# Patient Record
Sex: Male | Born: 1940 | Race: White | Hispanic: No | Smoking: Never smoker
Health system: Southern US, Community
[De-identification: ages and names within clinical notes are randomized; demographics above are authoritative.]

## PROBLEM LIST (undated history)

## (undated) DIAGNOSIS — I4891 Unspecified atrial fibrillation: Secondary | ICD-10-CM

## (undated) DIAGNOSIS — I10 Essential (primary) hypertension: Secondary | ICD-10-CM

## (undated) DIAGNOSIS — N189 Chronic kidney disease, unspecified: Secondary | ICD-10-CM

## (undated) DIAGNOSIS — Z8679 Personal history of other diseases of the circulatory system: Secondary | ICD-10-CM

## (undated) DIAGNOSIS — M1990 Unspecified osteoarthritis, unspecified site: Secondary | ICD-10-CM

## (undated) HISTORY — DX: Personal history of other diseases of the circulatory system: Z86.79

## (undated) HISTORY — DX: Unspecified osteoarthritis, unspecified site: M19.90

## (undated) HISTORY — PX: TONSILLECTOMY: SUR1361

## (undated) HISTORY — PX: INGUINAL HERNIA REPAIR: SUR1180

---

## 1998-09-06 ENCOUNTER — Ambulatory Visit (HOSPITAL_BASED_OUTPATIENT_CLINIC_OR_DEPARTMENT_OTHER): Admission: RE | Admit: 1998-09-06 | Discharge: 1998-09-06 | Payer: Self-pay | Admitting: *Deleted

## 2010-03-06 ENCOUNTER — Ambulatory Visit (HOSPITAL_COMMUNITY)
Admission: RE | Admit: 2010-03-06 | Discharge: 2010-03-06 | Payer: Self-pay | Source: Home / Self Care | Attending: General Surgery | Admitting: General Surgery

## 2010-03-17 NOTE — Op Note (Signed)
NAMEZACHARIAH, Austin Wong               ACCOUNT NO.:  [id number]  MEDICAL RECORD NO.:  [id number]          PATIENT TYPE:  AMB  LOCATION:  DAY                          FACILITY:  [facility]  PHYSICIAN:  Lennie Muckle, MD      DATE OF BIRTH:  12/07/1940  DATE OF PROCEDURE:  03/06/2010 DATE OF DISCHARGE:                              OPERATIVE REPORT   PREOPERATIVE DIAGNOSIS:  Right inguinal hernia.  POSTOPERATIVE DIAGNOSIS:  Direct right inguinal hernia.  PROCEDURE:  Laparoscopic right inguinal hernia repair with mesh.  SURGEON:  Madlyn Crosby L. Freida Busman, MD  ASSISTANT:  OR staff.  FINDINGS:  Direct inguinal hernia.  SPECIMENS:  None.  BLOOD LOSS:  Minimal.  COMPLICATIONS:  No immediate complications.  ANESTHESIA:  General endotracheal anesthesia.  INDICATIONS FOR PROCEDURE:  Austin Wong is a 70 year old male who underwent a left inguinal hernia repair by Dr. Rozetta Nunnery several years ago.  He was noted to have a right inguinal hernia, the hernia began to enlarge over the years.  He has had some mild discomfort with lifting.  He desired to have repair.  I explained the open and laparoscopic approach.  He desired to have laparoscopic approach stating that he was rather active.  DESCRIPTION OF PROCEDURE:  Austin Wong was identified in preoperative holding area.  I marked the right inguinal area.  He received preoperative antibiotics and seen by Anesthesia and then taken down to the operating room.  Once in the operating room, placed in supine position.  He had voided prior to coming to the operating room and Foley catheter was not placed.  After administration of general endotracheal anesthesia, sequential compression devices were applied to his lower extremity.  His abdomen was then prepped and draped in usual sterile fashion.  Surgical time-out was performed.  I began by placing an incision beneath the umbilicus via subcutaneous tissue with electrocautery and blunt dissection.  I  identified the anterior rectus fascia.  I incised the anterior rectus fascia with #11 blade, finger dissecting the preperitoneal space.  I lifted away the right external oblique fascia and placed a balloon spacer in the preperitoneal space. I insufflated this under inspection with camera.  After insufflating approximately 30 times, I retracted the balloon somewhat cranially. There was a band superiorly which I broke with my finger and replaced, laparoscopic space was closed and insufflated.  I then placed laparoscopic port in the preperitoneal space and obtained pneumoperitoneum.  I identified no injury and the peritoneum was intact. I then placed 2 ports in the midline under visualization with camera.  I began dissecting laterally along the abdominal wall, pushing the peritoneum inferiorly.  I then made my way down to the internal ring, identified the vas deferens and the spermatic cord vessels.  There was no evidence of hernia immediately along the spermatic cord.  However, immediately I did see a direct inguinal hernia in the abdominal wall. There were no intestinal contents within the hernia.  After I dissected around the spermatic cord vessels, I placed a 3 x 6 piece of Ultrapro mesh into the preperitoneal space.  I tacked this around the direct  hernia defect, and tacked Coopers ligament, and then superiorly around the hernia defect making sure the mesh was taut, I then placed the tack laterally while palpating along the abdominal wall.  With satisfactory placement of the mesh, I held the inferior edge and allowed pneumoperitoneum to release.  After releasing the pneumoperitoneum, I removed the trocars and closed the fascial defect at the umbilical region using 0 Vicryl suture.  Skin was closed with 4-0 Monocryl, Dermabond was placed on dressing at all 5.  The patient was awoken and transferred to postanesthesia care unit in stable condition and will be discharged home with Percocet,  start ibuprofen or Motrin tomorrow, and follow up with me in approximately 3 weeks.     Lennie Muckle, MD     ALA/MEDQ  D:  03/06/2010  T:  03/06/2010  Job:  [id number]  cc:   Gloriajean Dell. Andrey Campanile, M.D. Fax: [phone]  Electronically Signed by Bertram Savin MD on 03/17/2010 02:49:58 PM

## 2010-05-08 LAB — CBC
HCT: 46.7 % (ref 39.0–52.0)
Hemoglobin: 16.4 g/dL (ref 13.0–17.0)
MCH: 31.6 pg (ref 26.0–34.0)
MCHC: 35.1 g/dL (ref 30.0–36.0)
MCV: 90 fL (ref 78.0–100.0)
Platelets: 169 10*3/uL (ref 150–400)
RBC: 5.19 MIL/uL (ref 4.22–5.81)
RDW: 12.5 % (ref 11.5–15.5)
WBC: 7.1 10*3/uL (ref 4.0–10.5)

## 2010-05-08 LAB — DIFFERENTIAL
Basophils Absolute: 0.1 10*3/uL (ref 0.0–0.1)
Basophils Relative: 1 % (ref 0–1)
Eosinophils Absolute: 0.2 10*3/uL (ref 0.0–0.7)
Eosinophils Relative: 2 % (ref 0–5)
Lymphocytes Relative: 23 % (ref 12–46)
Lymphs Abs: 1.6 10*3/uL (ref 0.7–4.0)
Monocytes Absolute: 0.6 10*3/uL (ref 0.1–1.0)
Monocytes Relative: 8 % (ref 3–12)
Neutro Abs: 4.6 10*3/uL (ref 1.7–7.7)
Neutrophils Relative %: 66 % (ref 43–77)

## 2010-05-08 LAB — BASIC METABOLIC PANEL
BUN: 11 mg/dL (ref 6–23)
CO2: 29 mEq/L (ref 19–32)
Calcium: 9.8 mg/dL (ref 8.4–10.5)
Chloride: 100 mEq/L (ref 96–112)
Creatinine, Ser: 1.25 mg/dL (ref 0.4–1.5)
GFR calc Af Amer: >60 mL/min (ref >60)
GFR calc non Af Amer: 57 mL/min — ABNORMAL LOW (ref >60)
Glucose, Bld: 135 mg/dL — ABNORMAL HIGH (ref 70–99)
Potassium: 4.4 mEq/L (ref 3.5–5.1)
Sodium: 139 mEq/L (ref 135–145)

## 2010-05-08 LAB — SURGICAL PCR SCREEN
MRSA, PCR: NEGATIVE
Staphylococcus aureus: NEGATIVE

## 2011-10-08 ENCOUNTER — Encounter (INDEPENDENT_AMBULATORY_CARE_PROVIDER_SITE_OTHER): Payer: Medicare Other | Admitting: Ophthalmology

## 2011-10-08 DIAGNOSIS — H43819 Vitreous degeneration, unspecified eye: Secondary | ICD-10-CM

## 2011-10-08 DIAGNOSIS — H35039 Hypertensive retinopathy, unspecified eye: Secondary | ICD-10-CM

## 2011-10-08 DIAGNOSIS — H2510 Age-related nuclear cataract, unspecified eye: Secondary | ICD-10-CM

## 2011-10-08 DIAGNOSIS — I10 Essential (primary) hypertension: Secondary | ICD-10-CM

## 2016-02-06 DIAGNOSIS — H11432 Conjunctival hyperemia, left eye: Secondary | ICD-10-CM | POA: Diagnosis not present

## 2016-02-06 DIAGNOSIS — H53482 Generalized contraction of visual field, left eye: Secondary | ICD-10-CM | POA: Diagnosis not present

## 2016-02-06 DIAGNOSIS — H02403 Unspecified ptosis of bilateral eyelids: Secondary | ICD-10-CM | POA: Diagnosis not present

## 2016-06-13 DIAGNOSIS — D485 Neoplasm of uncertain behavior of skin: Secondary | ICD-10-CM | POA: Diagnosis not present

## 2016-06-14 DIAGNOSIS — I10 Essential (primary) hypertension: Secondary | ICD-10-CM | POA: Diagnosis not present

## 2016-07-09 DIAGNOSIS — M9905 Segmental and somatic dysfunction of pelvic region: Secondary | ICD-10-CM | POA: Diagnosis not present

## 2016-07-09 DIAGNOSIS — M9901 Segmental and somatic dysfunction of cervical region: Secondary | ICD-10-CM | POA: Diagnosis not present

## 2016-07-09 DIAGNOSIS — M9902 Segmental and somatic dysfunction of thoracic region: Secondary | ICD-10-CM | POA: Diagnosis not present

## 2016-07-09 DIAGNOSIS — M9903 Segmental and somatic dysfunction of lumbar region: Secondary | ICD-10-CM | POA: Diagnosis not present

## 2016-07-10 DIAGNOSIS — M9903 Segmental and somatic dysfunction of lumbar region: Secondary | ICD-10-CM | POA: Diagnosis not present

## 2016-07-10 DIAGNOSIS — M9902 Segmental and somatic dysfunction of thoracic region: Secondary | ICD-10-CM | POA: Diagnosis not present

## 2016-07-10 DIAGNOSIS — M9905 Segmental and somatic dysfunction of pelvic region: Secondary | ICD-10-CM | POA: Diagnosis not present

## 2016-07-10 DIAGNOSIS — M5431 Sciatica, right side: Secondary | ICD-10-CM | POA: Diagnosis not present

## 2016-07-10 DIAGNOSIS — M4806 Spinal stenosis, lumbar region: Secondary | ICD-10-CM | POA: Diagnosis not present

## 2016-07-10 DIAGNOSIS — M9901 Segmental and somatic dysfunction of cervical region: Secondary | ICD-10-CM | POA: Diagnosis not present

## 2016-07-10 DIAGNOSIS — M5136 Other intervertebral disc degeneration, lumbar region: Secondary | ICD-10-CM | POA: Diagnosis not present

## 2016-07-12 DIAGNOSIS — M9903 Segmental and somatic dysfunction of lumbar region: Secondary | ICD-10-CM | POA: Diagnosis not present

## 2016-07-12 DIAGNOSIS — M9905 Segmental and somatic dysfunction of pelvic region: Secondary | ICD-10-CM | POA: Diagnosis not present

## 2016-07-12 DIAGNOSIS — M4806 Spinal stenosis, lumbar region: Secondary | ICD-10-CM | POA: Diagnosis not present

## 2016-07-12 DIAGNOSIS — M5431 Sciatica, right side: Secondary | ICD-10-CM | POA: Diagnosis not present

## 2016-07-12 DIAGNOSIS — M9902 Segmental and somatic dysfunction of thoracic region: Secondary | ICD-10-CM | POA: Diagnosis not present

## 2016-07-12 DIAGNOSIS — M5136 Other intervertebral disc degeneration, lumbar region: Secondary | ICD-10-CM | POA: Diagnosis not present

## 2016-07-12 DIAGNOSIS — M9901 Segmental and somatic dysfunction of cervical region: Secondary | ICD-10-CM | POA: Diagnosis not present

## 2016-07-13 DIAGNOSIS — M9905 Segmental and somatic dysfunction of pelvic region: Secondary | ICD-10-CM | POA: Diagnosis not present

## 2016-07-13 DIAGNOSIS — M9901 Segmental and somatic dysfunction of cervical region: Secondary | ICD-10-CM | POA: Diagnosis not present

## 2016-07-13 DIAGNOSIS — M5431 Sciatica, right side: Secondary | ICD-10-CM | POA: Diagnosis not present

## 2016-07-13 DIAGNOSIS — M4806 Spinal stenosis, lumbar region: Secondary | ICD-10-CM | POA: Diagnosis not present

## 2016-07-13 DIAGNOSIS — M5136 Other intervertebral disc degeneration, lumbar region: Secondary | ICD-10-CM | POA: Diagnosis not present

## 2016-07-13 DIAGNOSIS — M9902 Segmental and somatic dysfunction of thoracic region: Secondary | ICD-10-CM | POA: Diagnosis not present

## 2016-07-13 DIAGNOSIS — M9903 Segmental and somatic dysfunction of lumbar region: Secondary | ICD-10-CM | POA: Diagnosis not present

## 2016-07-16 DIAGNOSIS — M4806 Spinal stenosis, lumbar region: Secondary | ICD-10-CM | POA: Diagnosis not present

## 2016-07-16 DIAGNOSIS — M5431 Sciatica, right side: Secondary | ICD-10-CM | POA: Diagnosis not present

## 2016-07-16 DIAGNOSIS — M9903 Segmental and somatic dysfunction of lumbar region: Secondary | ICD-10-CM | POA: Diagnosis not present

## 2016-07-16 DIAGNOSIS — M9905 Segmental and somatic dysfunction of pelvic region: Secondary | ICD-10-CM | POA: Diagnosis not present

## 2016-07-16 DIAGNOSIS — M9901 Segmental and somatic dysfunction of cervical region: Secondary | ICD-10-CM | POA: Diagnosis not present

## 2016-07-16 DIAGNOSIS — M5136 Other intervertebral disc degeneration, lumbar region: Secondary | ICD-10-CM | POA: Diagnosis not present

## 2016-07-16 DIAGNOSIS — M9902 Segmental and somatic dysfunction of thoracic region: Secondary | ICD-10-CM | POA: Diagnosis not present

## 2016-07-18 DIAGNOSIS — M9905 Segmental and somatic dysfunction of pelvic region: Secondary | ICD-10-CM | POA: Diagnosis not present

## 2016-07-18 DIAGNOSIS — M9902 Segmental and somatic dysfunction of thoracic region: Secondary | ICD-10-CM | POA: Diagnosis not present

## 2016-07-18 DIAGNOSIS — M9903 Segmental and somatic dysfunction of lumbar region: Secondary | ICD-10-CM | POA: Diagnosis not present

## 2016-07-18 DIAGNOSIS — M5136 Other intervertebral disc degeneration, lumbar region: Secondary | ICD-10-CM | POA: Diagnosis not present

## 2016-07-18 DIAGNOSIS — M4806 Spinal stenosis, lumbar region: Secondary | ICD-10-CM | POA: Diagnosis not present

## 2016-07-18 DIAGNOSIS — M9901 Segmental and somatic dysfunction of cervical region: Secondary | ICD-10-CM | POA: Diagnosis not present

## 2016-07-18 DIAGNOSIS — M5431 Sciatica, right side: Secondary | ICD-10-CM | POA: Diagnosis not present

## 2016-07-20 DIAGNOSIS — M9901 Segmental and somatic dysfunction of cervical region: Secondary | ICD-10-CM | POA: Diagnosis not present

## 2016-07-20 DIAGNOSIS — N401 Enlarged prostate with lower urinary tract symptoms: Secondary | ICD-10-CM | POA: Diagnosis not present

## 2016-07-20 DIAGNOSIS — L570 Actinic keratosis: Secondary | ICD-10-CM | POA: Diagnosis not present

## 2016-07-20 DIAGNOSIS — M9903 Segmental and somatic dysfunction of lumbar region: Secondary | ICD-10-CM | POA: Diagnosis not present

## 2016-07-20 DIAGNOSIS — M5136 Other intervertebral disc degeneration, lumbar region: Secondary | ICD-10-CM | POA: Diagnosis not present

## 2016-07-20 DIAGNOSIS — I10 Essential (primary) hypertension: Secondary | ICD-10-CM | POA: Diagnosis not present

## 2016-07-20 DIAGNOSIS — M5431 Sciatica, right side: Secondary | ICD-10-CM | POA: Diagnosis not present

## 2016-07-20 DIAGNOSIS — M9905 Segmental and somatic dysfunction of pelvic region: Secondary | ICD-10-CM | POA: Diagnosis not present

## 2016-07-20 DIAGNOSIS — M4806 Spinal stenosis, lumbar region: Secondary | ICD-10-CM | POA: Diagnosis not present

## 2016-07-20 DIAGNOSIS — M9902 Segmental and somatic dysfunction of thoracic region: Secondary | ICD-10-CM | POA: Diagnosis not present

## 2016-07-24 DIAGNOSIS — M9901 Segmental and somatic dysfunction of cervical region: Secondary | ICD-10-CM | POA: Diagnosis not present

## 2016-07-24 DIAGNOSIS — M5136 Other intervertebral disc degeneration, lumbar region: Secondary | ICD-10-CM | POA: Diagnosis not present

## 2016-07-24 DIAGNOSIS — M4806 Spinal stenosis, lumbar region: Secondary | ICD-10-CM | POA: Diagnosis not present

## 2016-07-24 DIAGNOSIS — M9902 Segmental and somatic dysfunction of thoracic region: Secondary | ICD-10-CM | POA: Diagnosis not present

## 2016-07-24 DIAGNOSIS — M9905 Segmental and somatic dysfunction of pelvic region: Secondary | ICD-10-CM | POA: Diagnosis not present

## 2016-07-24 DIAGNOSIS — M9903 Segmental and somatic dysfunction of lumbar region: Secondary | ICD-10-CM | POA: Diagnosis not present

## 2016-07-24 DIAGNOSIS — M5431 Sciatica, right side: Secondary | ICD-10-CM | POA: Diagnosis not present

## 2016-07-25 DIAGNOSIS — M9901 Segmental and somatic dysfunction of cervical region: Secondary | ICD-10-CM | POA: Diagnosis not present

## 2016-07-25 DIAGNOSIS — M9903 Segmental and somatic dysfunction of lumbar region: Secondary | ICD-10-CM | POA: Diagnosis not present

## 2016-07-25 DIAGNOSIS — M9902 Segmental and somatic dysfunction of thoracic region: Secondary | ICD-10-CM | POA: Diagnosis not present

## 2016-07-25 DIAGNOSIS — M4806 Spinal stenosis, lumbar region: Secondary | ICD-10-CM | POA: Diagnosis not present

## 2016-07-25 DIAGNOSIS — M5136 Other intervertebral disc degeneration, lumbar region: Secondary | ICD-10-CM | POA: Diagnosis not present

## 2016-07-25 DIAGNOSIS — M9905 Segmental and somatic dysfunction of pelvic region: Secondary | ICD-10-CM | POA: Diagnosis not present

## 2016-07-25 DIAGNOSIS — M5431 Sciatica, right side: Secondary | ICD-10-CM | POA: Diagnosis not present

## 2016-07-27 DIAGNOSIS — M9902 Segmental and somatic dysfunction of thoracic region: Secondary | ICD-10-CM | POA: Diagnosis not present

## 2016-07-27 DIAGNOSIS — M9901 Segmental and somatic dysfunction of cervical region: Secondary | ICD-10-CM | POA: Diagnosis not present

## 2016-07-27 DIAGNOSIS — M5136 Other intervertebral disc degeneration, lumbar region: Secondary | ICD-10-CM | POA: Diagnosis not present

## 2016-07-27 DIAGNOSIS — M4806 Spinal stenosis, lumbar region: Secondary | ICD-10-CM | POA: Diagnosis not present

## 2016-07-27 DIAGNOSIS — M9903 Segmental and somatic dysfunction of lumbar region: Secondary | ICD-10-CM | POA: Diagnosis not present

## 2016-07-27 DIAGNOSIS — M9905 Segmental and somatic dysfunction of pelvic region: Secondary | ICD-10-CM | POA: Diagnosis not present

## 2016-07-27 DIAGNOSIS — M5431 Sciatica, right side: Secondary | ICD-10-CM | POA: Diagnosis not present

## 2016-07-30 DIAGNOSIS — M5431 Sciatica, right side: Secondary | ICD-10-CM | POA: Diagnosis not present

## 2016-07-30 DIAGNOSIS — M4806 Spinal stenosis, lumbar region: Secondary | ICD-10-CM | POA: Diagnosis not present

## 2016-07-30 DIAGNOSIS — M9902 Segmental and somatic dysfunction of thoracic region: Secondary | ICD-10-CM | POA: Diagnosis not present

## 2016-07-30 DIAGNOSIS — M9901 Segmental and somatic dysfunction of cervical region: Secondary | ICD-10-CM | POA: Diagnosis not present

## 2016-07-30 DIAGNOSIS — M9903 Segmental and somatic dysfunction of lumbar region: Secondary | ICD-10-CM | POA: Diagnosis not present

## 2016-07-30 DIAGNOSIS — M5136 Other intervertebral disc degeneration, lumbar region: Secondary | ICD-10-CM | POA: Diagnosis not present

## 2016-07-30 DIAGNOSIS — M9905 Segmental and somatic dysfunction of pelvic region: Secondary | ICD-10-CM | POA: Diagnosis not present

## 2016-08-01 DIAGNOSIS — M5136 Other intervertebral disc degeneration, lumbar region: Secondary | ICD-10-CM | POA: Diagnosis not present

## 2016-08-01 DIAGNOSIS — M9905 Segmental and somatic dysfunction of pelvic region: Secondary | ICD-10-CM | POA: Diagnosis not present

## 2016-08-01 DIAGNOSIS — M9901 Segmental and somatic dysfunction of cervical region: Secondary | ICD-10-CM | POA: Diagnosis not present

## 2016-08-01 DIAGNOSIS — M9902 Segmental and somatic dysfunction of thoracic region: Secondary | ICD-10-CM | POA: Diagnosis not present

## 2016-08-01 DIAGNOSIS — M5431 Sciatica, right side: Secondary | ICD-10-CM | POA: Diagnosis not present

## 2016-08-01 DIAGNOSIS — M9903 Segmental and somatic dysfunction of lumbar region: Secondary | ICD-10-CM | POA: Diagnosis not present

## 2016-08-03 DIAGNOSIS — M9901 Segmental and somatic dysfunction of cervical region: Secondary | ICD-10-CM | POA: Diagnosis not present

## 2016-08-03 DIAGNOSIS — M9902 Segmental and somatic dysfunction of thoracic region: Secondary | ICD-10-CM | POA: Diagnosis not present

## 2016-08-03 DIAGNOSIS — M9905 Segmental and somatic dysfunction of pelvic region: Secondary | ICD-10-CM | POA: Diagnosis not present

## 2016-08-03 DIAGNOSIS — M5136 Other intervertebral disc degeneration, lumbar region: Secondary | ICD-10-CM | POA: Diagnosis not present

## 2016-08-03 DIAGNOSIS — M5431 Sciatica, right side: Secondary | ICD-10-CM | POA: Diagnosis not present

## 2016-08-03 DIAGNOSIS — M9903 Segmental and somatic dysfunction of lumbar region: Secondary | ICD-10-CM | POA: Diagnosis not present

## 2016-08-06 DIAGNOSIS — M9902 Segmental and somatic dysfunction of thoracic region: Secondary | ICD-10-CM | POA: Diagnosis not present

## 2016-08-06 DIAGNOSIS — M9901 Segmental and somatic dysfunction of cervical region: Secondary | ICD-10-CM | POA: Diagnosis not present

## 2016-08-06 DIAGNOSIS — M9903 Segmental and somatic dysfunction of lumbar region: Secondary | ICD-10-CM | POA: Diagnosis not present

## 2016-08-06 DIAGNOSIS — M5136 Other intervertebral disc degeneration, lumbar region: Secondary | ICD-10-CM | POA: Diagnosis not present

## 2016-08-06 DIAGNOSIS — M5431 Sciatica, right side: Secondary | ICD-10-CM | POA: Diagnosis not present

## 2016-08-06 DIAGNOSIS — M9905 Segmental and somatic dysfunction of pelvic region: Secondary | ICD-10-CM | POA: Diagnosis not present

## 2016-11-14 DIAGNOSIS — H02412 Mechanical ptosis of left eyelid: Secondary | ICD-10-CM | POA: Diagnosis not present

## 2016-11-14 DIAGNOSIS — H34831 Tributary (branch) retinal vein occlusion, right eye, with macular edema: Secondary | ICD-10-CM | POA: Diagnosis not present

## 2017-02-14 DIAGNOSIS — H02412 Mechanical ptosis of left eyelid: Secondary | ICD-10-CM | POA: Diagnosis not present

## 2017-09-20 DIAGNOSIS — R0609 Other forms of dyspnea: Secondary | ICD-10-CM | POA: Diagnosis not present

## 2017-09-20 DIAGNOSIS — R0600 Dyspnea, unspecified: Secondary | ICD-10-CM | POA: Diagnosis not present

## 2017-09-20 DIAGNOSIS — I481 Persistent atrial fibrillation: Secondary | ICD-10-CM | POA: Diagnosis not present

## 2017-09-21 ENCOUNTER — Inpatient Hospital Stay (HOSPITAL_COMMUNITY)
Admission: EM | Admit: 2017-09-21 | Discharge: 2017-09-25 | DRG: 286 | Disposition: A | Discharge status: Home / Self Care | Payer: PPO | Attending: Family Medicine | Admitting: Family Medicine

## 2017-09-21 ENCOUNTER — Other Ambulatory Visit: Payer: Self-pay

## 2017-09-21 ENCOUNTER — Encounter (HOSPITAL_COMMUNITY): Payer: Self-pay | Admitting: Emergency Medicine

## 2017-09-21 ENCOUNTER — Emergency Department (HOSPITAL_COMMUNITY): Payer: PPO

## 2017-09-21 DIAGNOSIS — I5023 Acute on chronic systolic (congestive) heart failure: Secondary | ICD-10-CM | POA: Diagnosis present

## 2017-09-21 DIAGNOSIS — I5041 Acute combined systolic (congestive) and diastolic (congestive) heart failure: Secondary | ICD-10-CM | POA: Diagnosis not present

## 2017-09-21 DIAGNOSIS — E876 Hypokalemia: Secondary | ICD-10-CM | POA: Diagnosis not present

## 2017-09-21 DIAGNOSIS — N183 Chronic kidney disease, stage 3 (moderate): Secondary | ICD-10-CM | POA: Diagnosis present

## 2017-09-21 DIAGNOSIS — I272 Pulmonary hypertension, unspecified: Secondary | ICD-10-CM | POA: Diagnosis not present

## 2017-09-21 DIAGNOSIS — I509 Heart failure, unspecified: Secondary | ICD-10-CM | POA: Diagnosis not present

## 2017-09-21 DIAGNOSIS — N179 Acute kidney failure, unspecified: Secondary | ICD-10-CM | POA: Diagnosis not present

## 2017-09-21 DIAGNOSIS — I130 Hypertensive heart and chronic kidney disease with heart failure and stage 1 through stage 4 chronic kidney disease, or unspecified chronic kidney disease: Principal | ICD-10-CM | POA: Diagnosis present

## 2017-09-21 DIAGNOSIS — Z8249 Family history of ischemic heart disease and other diseases of the circulatory system: Secondary | ICD-10-CM

## 2017-09-21 DIAGNOSIS — I4891 Unspecified atrial fibrillation: Secondary | ICD-10-CM | POA: Diagnosis not present

## 2017-09-21 DIAGNOSIS — N189 Chronic kidney disease, unspecified: Secondary | ICD-10-CM | POA: Diagnosis present

## 2017-09-21 DIAGNOSIS — I10 Essential (primary) hypertension: Secondary | ICD-10-CM | POA: Diagnosis present

## 2017-09-21 DIAGNOSIS — R748 Abnormal levels of other serum enzymes: Secondary | ICD-10-CM | POA: Diagnosis not present

## 2017-09-21 DIAGNOSIS — J90 Pleural effusion, not elsewhere classified: Secondary | ICD-10-CM | POA: Diagnosis not present

## 2017-09-21 DIAGNOSIS — Z79899 Other long term (current) drug therapy: Secondary | ICD-10-CM

## 2017-09-21 DIAGNOSIS — R7989 Other specified abnormal findings of blood chemistry: Secondary | ICD-10-CM

## 2017-09-21 DIAGNOSIS — Z888 Allergy status to other drugs, medicaments and biological substances status: Secondary | ICD-10-CM | POA: Diagnosis not present

## 2017-09-21 DIAGNOSIS — I248 Other forms of acute ischemic heart disease: Secondary | ICD-10-CM | POA: Diagnosis present

## 2017-09-21 DIAGNOSIS — I4821 Permanent atrial fibrillation: Secondary | ICD-10-CM

## 2017-09-21 DIAGNOSIS — I351 Nonrheumatic aortic (valve) insufficiency: Secondary | ICD-10-CM | POA: Diagnosis not present

## 2017-09-21 DIAGNOSIS — I2510 Atherosclerotic heart disease of native coronary artery without angina pectoris: Secondary | ICD-10-CM | POA: Diagnosis present

## 2017-09-21 DIAGNOSIS — Z87891 Personal history of nicotine dependence: Secondary | ICD-10-CM

## 2017-09-21 DIAGNOSIS — R778 Other specified abnormalities of plasma proteins: Secondary | ICD-10-CM

## 2017-09-21 DIAGNOSIS — R0602 Shortness of breath: Secondary | ICD-10-CM

## 2017-09-21 DIAGNOSIS — I5021 Acute systolic (congestive) heart failure: Secondary | ICD-10-CM | POA: Diagnosis not present

## 2017-09-21 HISTORY — DX: Essential (primary) hypertension: I10

## 2017-09-21 HISTORY — DX: Chronic kidney disease, unspecified: N18.9

## 2017-09-21 LAB — I-STAT TROPONIN, ED: Troponin i, poc: 0.11 ng/mL (ref 0.00–0.08)

## 2017-09-21 LAB — BASIC METABOLIC PANEL
Anion gap: 10 (ref 5–15)
BUN: 18 mg/dL (ref 8–23)
CO2: 24 mmol/L (ref 22–32)
Calcium: 9.4 mg/dL (ref 8.9–10.3)
Chloride: 100 mmol/L (ref 98–111)
Creatinine, Ser: 1.43 mg/dL — ABNORMAL HIGH (ref 0.61–1.24)
GFR calc Af Amer: 53 mL/min — ABNORMAL LOW (ref >60)
GFR calc non Af Amer: 45 mL/min — ABNORMAL LOW (ref >60)
Glucose, Bld: 159 mg/dL — ABNORMAL HIGH (ref 70–99)
Potassium: 4.1 mmol/L (ref 3.5–5.1)
Sodium: 134 mmol/L — ABNORMAL LOW (ref 135–145)

## 2017-09-21 LAB — PROTIME-INR
INR: 1.14
Prothrombin Time: 14.5 seconds (ref 11.4–15.2)

## 2017-09-21 LAB — CBC
HCT: 47.3 % (ref 39.0–52.0)
Hemoglobin: 15.7 g/dL (ref 13.0–17.0)
MCH: 30.4 pg (ref 26.0–34.0)
MCHC: 33.2 g/dL (ref 30.0–36.0)
MCV: 91.7 fL (ref 78.0–100.0)
Platelets: 132 10*3/uL — ABNORMAL LOW (ref 150–400)
RBC: 5.16 MIL/uL (ref 4.22–5.81)
RDW: 13.5 % (ref 11.5–15.5)
WBC: 7.6 10*3/uL (ref 4.0–10.5)

## 2017-09-21 LAB — HEPATIC FUNCTION PANEL
ALBUMIN: 3.4 g/dL — AB (ref 3.5–5.0)
ALK PHOS: 79 U/L (ref 38–126)
ALT: 57 U/L — AB (ref 0–44)
AST: 53 U/L — ABNORMAL HIGH (ref 15–41)
BILIRUBIN TOTAL: 1.8 mg/dL — AB (ref 0.3–1.2)
Bilirubin, Direct: 0.4 mg/dL — ABNORMAL HIGH (ref 0.0–0.2)
Indirect Bilirubin: 1.4 mg/dL — ABNORMAL HIGH (ref 0.3–0.9)
Total Protein: 5.9 g/dL — ABNORMAL LOW (ref 6.5–8.1)

## 2017-09-21 LAB — BRAIN NATRIURETIC PEPTIDE: B Natriuretic Peptide: 530.7 pg/mL — ABNORMAL HIGH (ref 0.0–100.0)

## 2017-09-21 LAB — TROPONIN I: TROPONIN I: 0.17 ng/mL — AB (ref <0.03)

## 2017-09-21 LAB — APTT: APTT: 34 s (ref 24–36)

## 2017-09-21 MED ORDER — CARVEDILOL 3.125 MG PO TABS
3.1250 mg | ORAL_TABLET | Freq: Two times a day (BID) | ORAL | Status: DC
  Administered 2017-09-21 – 2017-09-22 (×2): 3.125 mg via ORAL
  Filled 2017-09-21 (×2): qty 1

## 2017-09-21 MED ORDER — FUROSEMIDE 10 MG/ML IJ SOLN
20.0000 mg | Freq: Once | INTRAMUSCULAR | Status: AC
  Administered 2017-09-21: 20 mg via INTRAVENOUS
  Filled 2017-09-21: qty 2

## 2017-09-21 MED ORDER — SODIUM CHLORIDE 0.9% FLUSH
3.0000 mL | Freq: Two times a day (BID) | INTRAVENOUS | Status: DC
  Administered 2017-09-21 – 2017-09-25 (×7): 3 mL via INTRAVENOUS

## 2017-09-21 MED ORDER — APIXABAN 5 MG PO TABS
5.0000 mg | ORAL_TABLET | Freq: Two times a day (BID) | ORAL | Status: DC
  Administered 2017-09-21 – 2017-09-22 (×3): 5 mg via ORAL
  Filled 2017-09-21 (×4): qty 1

## 2017-09-21 NOTE — ED Notes (Signed)
Patient transported to X-ray 

## 2017-09-21 NOTE — ED Provider Notes (Signed)
[facility] EMERGENCY DEPARTMENT Provider Note   CSN: [id number] Arrival date & time: 09/21/17  1522     History   Chief Complaint Chief Complaint  Patient presents with  . Shortness of Breath  . Tachycardia    HPI Austin Wong is a 77 y.o. male who presents for intermittent dyspnea. The paitent saw his PCP yesterday and was diagnosed with A. fib yesterday at his PCPs office.  He was started on metoprolol.  States that he took it last night and this morning but it made him feel "extremely sick to his stomach" and he feels he cannot take it.  Patient has had several months of intermittent episodes of what the patient describes as a pressure-like sensation building up from his stomach into the lower part of his chest that makes him feel extremely short of breath.  He denies pain.  It is not associated with exertion.  He has had episodes of orthopnea and PND as well and has to sleep sitting greater than 30 degrees.  Patient states that this morning he had onset of those same symptoms.  His wife was at bedside states that it is the worst she has ever seen him.  She states "he was literally gasping for air and I thought he was going to die."  He denies any sensation of racing heart.  He says at times when this occurs he feels like he is lightheaded or going to pass out.  He denies nausea, vomiting, diaphoresis, neck pain, arm pain or chest pain.  The patient was not started on a blood thinner yesterday. HPI  History reviewed. No pertinent past medical history.  There are no active problems to display for this patient.   History reviewed. No pertinent surgical history.      Home Medications    Prior to Admission medications   Not on File    Family History No family history on file.  Social History Social History   Tobacco Use  . Smoking status: Not on file  Substance Use Topics  . Alcohol use: Not on file  . Drug use: Not on file     Allergies     Patient has no known allergies.   Review of Systems Review of Systems  Ten systems reviewed and are negative for acute change, except as noted in the HPI.   Physical Exam Updated Vital Signs BP (!) 164/117 (BP Location: Right Arm)   Pulse (!) 116   Temp 98.2 F (36.8 C) (Oral)   Resp 18   Ht 6\' 1"  (1.854 m)   Wt 71.7 kg (158 lb)   SpO2 100%   BMI 20.85 kg/m   Physical Exam  Physical Exam  Nursing note and vitals reviewed. Constitutional: He appears well-developed and well-nourished. No distress.  HENT:  Head: Normocephalic and atraumatic.  Eyes: Conjunctivae normal are normal. No scleral icterus.  Neck: Normal range of motion. Neck supple.  Cardiovascular: Normal rate, regular rhythm and normal heart sounds.   Pulmonary/Chest: Effort normal and breath sounds normal. No respiratory distress.  Abdominal: Soft. There is no tenderness.  Musculoskeletal: He exhibits no edema.  Neurological: He is alert.  Skin: Skin is warm and dry. He is not diaphoretic.  Psychiatric: His behavior is normal.    ED Treatments / Results  Labs (all labs ordered are listed, but only abnormal results are displayed) Labs Reviewed  CBC - Abnormal; Notable for the following components:      Result Value  Platelets 132 (*)    All other components within normal limits  I-STAT TROPONIN, ED - Abnormal; Notable for the following components:   Troponin i, poc 0.11 (*)    All other components within normal limits  BASIC METABOLIC PANEL    EKG EKG Interpretation  Date/Time:  Saturday September 21 2017 15:30:46 EDT Ventricular Rate:  113 PR Interval:    QRS Duration: 88 QT Interval:  336 QTC Calculation: 460 R Axis:   -34 Text Interpretation:  new  Atrial fibrillation with rapid ventricular response Left axis deviation Nonspecific T wave abnormality Confirmed by Blanchie Dessert [phone]) on 09/21/2017 4:11:28 PM   Radiology No results found.  Procedures Procedures (including critical care  time)  Medications Ordered in ED Medications - No data to display   Initial Impression / Assessment and Plan / ED Course  I have reviewed the triage vital signs and the nursing notes.  Pertinent labs & imaging results that were available during my care of the patient were reviewed by me and considered in my medical decision making (see chart for details).  Clinical Course as of Sep 21 2000  Sat Sep 21, 2017  1958 Sodium(!): 134 [AH]  1958 Glucose(!): 159 [AH]  1958 Creatinine(!): 1.43 [AH]  1958 Patient with elevated blood glucose, mild renal sufficiency.  The patient does not take any pharmaceutical medications, by our conversation seems to be suspicious of "manufactured chemicals."   [AH]  1959 Recent BNP is elevated.  He has bilateral pleural effusions which are small but visible on the chest x-ray which I have personally reviewed and I agree radiologic read.  B Natriuretic Peptide(!): 530.7 [AH]  2000 EKG is reviewed and shows A. fib with RVR.  Patient currently rate controlled at the time of my evaluation   [AH]  2000 Patient's troponin is elevated likely secondary to his A. fib earlier today.  I doubt ACS this is likely secondary to demand ischemia in the setting of rapid ventricular rate.  Troponin i, poc(!!): 0.11 [AH]    Clinical Course User Index [AH] Margarita Mail, PA-C   This is a 77 year old man who presents with shortness of breath, A. fib with RVR and elevated troponin. The setting of new diagnosis and patient who is currently not anticoagulated feel it would be appropriate for the patient to be admitted.  Secondarily, she has bilateral pleural effusions, elevated BNP, orthopnea, PND and hypertension.  The chest x-ray also shows cardiomegaly and the patient likely has diagnosed chronic illnesses that due to his aversion to pharmaceutical medications have not yet been managed appropriately.  Patient will be admitted to the hospitalist service at this time.  He has had no  recurrence of the symptoms that brought him into the emergency department.  I have gathered history from both the patient, the EMR, and the patient's wife who is at bedside.  Patient is stable throughout his visit in the emergency department.  He is currently rate controlled with a pulse in the 80s.  Seen and shared visit with Dr. Maryan Rued.  Final Clinical Impressions(s) / ED Diagnoses   Final diagnoses:  None    ED Discharge Orders    None       Margarita Mail, PA-C 09/21/17 Ladell Heads, MD 09/22/17 [id number]

## 2017-09-21 NOTE — H&P (Signed)
History and Physical   Austin Wong GUY:[id number] DOB: Nov 11, 1940 DOA: 09/21/2017  PCP: No primary care provider on file.  Chief Complaint: shortness of breath  HPI: this is a 77 year old man with recently diagnosed atrial fibrillation, as well as hypertension based on chart review presenting with shortness of breath.  Patient reports onset 2 weeks ago, has been progressively getting worse. He reports 2 pillow orthopnea, paroxysmal nocturnal dyspnea. He also reports some increased weakness globally. At baseline he lives with his spouse, is retired Furniture conservator/restorer, he's a former smoker less than 1-pack-year history of smoking. Reports formerly drinking alcohol regular basis, stopped over 5 years ago. Most activity includes using a push lawnmower for 45 minutes weekly.  He reports being diagnosed ventral fibrillation within the past week was primary care physician. He was started on metoprolol however express nausea-initially he thought this was due to taking it on an empty stomach, however this symptom persisted after he adjusted taking the beta blocker with meals. He denies ever having history of heart failure, stroke, heart attack, cancer, syncope. He denies palpitations. Of note, outside records review total bilirubin of 1.8 on PCP visit the past week. He takes a wide variety of supplements. His economize wife.  ED Course: vital signs initially with a heart rate of 116, normalized to less than 100.  Systolic blood pressure 563 time of admission. CBC unremarkable, BMP revealed creatinine of 1.4.  Chest x-ray revealed COPD and evidence of cardiomegaly. BNP of 530.  He was given 20 g of IV Lasix. EKG revealed A. Fib RVR with rate less than 120. Hospital medicine was consult further management.  Review of Systems: A complete ROS was obtained; pertinent positives negatives are denoted in the HPI. Otherwise, all systems are negative.   History reviewed. No pertinent past medical history. Social History    Socioeconomic History  . Marital status: Married    Spouse name: Not on file  . Number of children: Not on file  . Years of education: Not on file  . Highest education level: Not on file  Occupational History  . Not on file  Social Needs  . Financial resource strain: Not on file  . Food insecurity:    Worry: Not on file    Inability: Not on file  . Transportation needs:    Medical: Not on file    Non-medical: Not on file  Tobacco Use  . Smoking status: Not on file  Substance and Sexual Activity  . Alcohol use: Not on file  . Drug use: Not on file  . Sexual activity: Not on file  Lifestyle  . Physical activity:    Days per week: Not on file    Minutes per session: Not on file  . Stress: Not on file  Relationships  . Social connections:    Talks on phone: Not on file    Gets together: Not on file    Attends religious service: Not on file    Active member of club or organization: Not on file    Attends meetings of clubs or organizations: Not on file    Relationship status: Not on file  . Intimate partner violence:    Fear of current or ex partner: Not on file    Emotionally abused: Not on file    Physically abused: Not on file    Forced sexual activity: Not on file  Other Topics Concern  . Not on file  Social History Narrative  . Not on file  Family hx: father died of MI  Physical Exam: Vitals:   09/21/17 1900 09/21/17 1915 09/21/17 1930 09/21/17 1945  BP: (!) 161/116 (!) 151/131 (!) 149/129 (!) 153/140  Pulse:      Resp: 13 15 (!) 21 18  Temp:      TempSrc:      SpO2:      Weight:      Height:       General: Appears calm and comfortable. Elderly white man ENT: Grossly normal hearing with use of right side hearing aid, MMM. Cardiovascular: Irregular rhythm. No M/R/G. No LE edema. No JVD appreciated. Respiratory: CTA bilaterally. No wheezes or crackles. Normal respiratory effort.  Breathing room air. Abdomen: Soft, non-tender. Skin: No rash or  induration seen on limited exam. Musculoskeletal: Grossly normal tone BUE/BLE. Appropriate ROM.  Psychiatric: Grossly normal mood and affect. Neurologic: Moves all extremities in coordinated fashion.  I have personally reviewed the following labs, culture data, and imaging studies.  Assessment/Plan:  #Acute decompensated heart failure, likely, unknown type Symptoms of worsening DOE, orthopnea, PND in setting of CXR revealing cardiomegaly and elevated BNP.  Mild hyperbilirubinemia and AST/ALT elevation may be a result of hepatic congestion; possible that his nausea that he attributed to his BB is in fact a result of his HF.   Plan: Will assess urine output overnight from his 20 mg IV furosemide. TTE to further assess.  Daily weights, 1500 fluid restrict, low salt diet.  Re-evaluate for further diuresis in AM.  #Atrial fibrillation, newly discovered Discussed with the patient the role for rate control and AC for CVA prevention.  He voiced preference for apixaban for [facility] of choice. Plan: Given prior nausea to metoprolol, he is amenable to try carvedilol. Will initiate at low dose.  Starting apixaban for stroke prevention. Bleeding risks discussed. Telemetry.  #Elevated troponin Suspect demand in setting of AF RVR.  No CP. No ST elevation on EKG.  Will trend until peak defined.  DVT prophylaxis: on DOAC Code Status: full code Disposition Plan: Anticipate D/C home in 2-5 days Consults called: none Admission status: admit to hospital medicine   Cheri Rous, MD [facility] Page:[phone]  If 7PM-7AM, please contact night-coverage [email or web address] Password [email or web address]

## 2017-09-21 NOTE — ED Triage Notes (Signed)
Pt has been experiencing episodes of SOB and tachycardia.  Pt was diagnosed yesterday w/ new onset afib and started on meds.  His last "attack" at home was bad enough to "come to the hospital.  At this time "the episode is over"

## 2017-09-21 NOTE — ED Notes (Signed)
Attempted report 

## 2017-09-21 NOTE — Progress Notes (Signed)
Trop level called into on-call MD. Awaiting response.

## 2017-09-22 ENCOUNTER — Inpatient Hospital Stay (HOSPITAL_COMMUNITY): Payer: PPO

## 2017-09-22 ENCOUNTER — Other Ambulatory Visit: Payer: Self-pay

## 2017-09-22 ENCOUNTER — Encounter (HOSPITAL_COMMUNITY): Payer: Self-pay

## 2017-09-22 DIAGNOSIS — R778 Other specified abnormalities of plasma proteins: Secondary | ICD-10-CM

## 2017-09-22 DIAGNOSIS — R7989 Other specified abnormal findings of blood chemistry: Secondary | ICD-10-CM

## 2017-09-22 DIAGNOSIS — I351 Nonrheumatic aortic (valve) insufficiency: Secondary | ICD-10-CM

## 2017-09-22 DIAGNOSIS — I10 Essential (primary) hypertension: Secondary | ICD-10-CM | POA: Diagnosis present

## 2017-09-22 DIAGNOSIS — I4891 Unspecified atrial fibrillation: Secondary | ICD-10-CM | POA: Diagnosis not present

## 2017-09-22 DIAGNOSIS — R748 Abnormal levels of other serum enzymes: Secondary | ICD-10-CM

## 2017-09-22 DIAGNOSIS — J90 Pleural effusion, not elsewhere classified: Secondary | ICD-10-CM

## 2017-09-22 DIAGNOSIS — N189 Chronic kidney disease, unspecified: Secondary | ICD-10-CM | POA: Diagnosis present

## 2017-09-22 DIAGNOSIS — I4821 Permanent atrial fibrillation: Secondary | ICD-10-CM

## 2017-09-22 DIAGNOSIS — I509 Heart failure, unspecified: Secondary | ICD-10-CM

## 2017-09-22 LAB — COMPREHENSIVE METABOLIC PANEL
ALT: 53 U/L — AB (ref 0–44)
ANION GAP: 11 (ref 5–15)
AST: 46 U/L — ABNORMAL HIGH (ref 15–41)
Albumin: 3.3 g/dL — ABNORMAL LOW (ref 3.5–5.0)
Alkaline Phosphatase: 72 U/L (ref 38–126)
BUN: 17 mg/dL (ref 8–23)
CHLORIDE: 101 mmol/L (ref 98–111)
CO2: 28 mmol/L (ref 22–32)
Calcium: 9 mg/dL (ref 8.9–10.3)
Creatinine, Ser: 1.26 mg/dL — ABNORMAL HIGH (ref 0.61–1.24)
GFR calc non Af Amer: 53 mL/min — ABNORMAL LOW (ref >60)
Glucose, Bld: 149 mg/dL — ABNORMAL HIGH (ref 70–99)
Potassium: 3.4 mmol/L — ABNORMAL LOW (ref 3.5–5.1)
SODIUM: 140 mmol/L (ref 135–145)
Total Bilirubin: 2.1 mg/dL — ABNORMAL HIGH (ref 0.3–1.2)
Total Protein: 5.9 g/dL — ABNORMAL LOW (ref 6.5–8.1)

## 2017-09-22 LAB — CBC
HCT: 43.1 % (ref 39.0–52.0)
Hemoglobin: 14.2 g/dL (ref 13.0–17.0)
MCH: 29.9 pg (ref 26.0–34.0)
MCHC: 32.9 g/dL (ref 30.0–36.0)
MCV: 90.7 fL (ref 78.0–100.0)
Platelets: 117 10*3/uL — ABNORMAL LOW (ref 150–400)
RBC: 4.75 MIL/uL (ref 4.22–5.81)
RDW: 13.5 % (ref 11.5–15.5)
WBC: 5.5 10*3/uL (ref 4.0–10.5)

## 2017-09-22 LAB — TROPONIN I: Troponin I: 0.16 ng/mL (ref <0.03)

## 2017-09-22 LAB — D-DIMER, QUANTITATIVE: D-Dimer, Quant: 1.02 ug/mL-FEU — ABNORMAL HIGH (ref 0.00–0.50)

## 2017-09-22 LAB — TSH: TSH: 4.472 u[IU]/mL (ref 0.350–4.500)

## 2017-09-22 MED ORDER — POTASSIUM CHLORIDE CRYS ER 20 MEQ PO TBCR
40.0000 meq | EXTENDED_RELEASE_TABLET | Freq: Once | ORAL | Status: AC
  Administered 2017-09-22: 40 meq via ORAL
  Filled 2017-09-22: qty 2

## 2017-09-22 MED ORDER — CARVEDILOL 6.25 MG PO TABS
6.2500 mg | ORAL_TABLET | Freq: Two times a day (BID) | ORAL | Status: DC
  Administered 2017-09-22 – 2017-09-25 (×6): 6.25 mg via ORAL
  Filled 2017-09-22 (×6): qty 1

## 2017-09-22 MED ORDER — TECHNETIUM TO 99M ALBUMIN AGGREGATED
4.3700 | Freq: Once | INTRAVENOUS | Status: AC | PRN
  Administered 2017-09-22: 4.37 via INTRAVENOUS

## 2017-09-22 MED ORDER — TECHNETIUM TC 99M DIETHYLENETRIAME-PENTAACETIC ACID
32.0000 | Freq: Once | INTRAVENOUS | Status: AC | PRN
  Administered 2017-09-22: 32 via RESPIRATORY_TRACT

## 2017-09-22 NOTE — Progress Notes (Signed)
Patient resting comfortably during shift report. Denies complaints.  

## 2017-09-22 NOTE — Progress Notes (Signed)
Patient brought to unit by Suezanne Jacquet, RN who gave report concerning patient to this nurse. Patient alert and oriented and with no outward s/s of distress. Patient transferred self without assistance to his bed.

## 2017-09-22 NOTE — H&P (Addendum)
Admit date: 09/21/2017 Referring Physician  Dr. Marthenia Rolling Primary Physician  Dr. Kathryne Eriksson Primary Cardiologist  None Reason for Consultation  New onset afib and CHF  HPI: Austin Wong is a 77 y.o. male who is being seen today for the evaluation of new onset afib and CHF at the request of Dr. Marthenia Rolling.  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  Also noted some weakness.  He denies any h/o tobacco use but does drink 1 beer per week. 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  He went to see his PCP who diagnosed him with new onset atrial fibrillation and he was started on beta-blockers.  Unfortunately he developed nausea even when taking them after eating and was changed to Carvedilol.   He presented to the emergency room today and was noted to be in A. fib with RVR to 116 bpm.  He was hypertensive with a systolic blood pressure 655 mmHg.  Potassium was 4.1 and creatinine was 1.43.  In reviewing chart his creatinine was 1.25 in 2012.  BNP was mildly elevated at 530 with chest x-ray showing COPD, cardiomegaly and small bilateral pleural effusions with bibasilar atelectasis.  Troponin was mildly elevated at 0.11> 0.17>0.16.  Cardiology is now asked to consult for help in management of atrial fibrillation as well as CHF.  Of note patient's d-dimer was elevated at 1.02 and VQ scan is pending.  TSH was normal at 4.47.  He was given lasix 20mg  IV last night and was able to lay flat to sleep without any SOB last night. He denies any CP, LE edema, dizziness or syncope.  He has noticed from time to time that his heart will beat irregularly.    PMH:   Past Medical History:  Diagnosis Date  . Chronic kidney disease    Creatinine 1.2 in 2012  . Hypertension    Diet controlled in the  past     PSH:   Past Surgical History:  Procedure Laterality Date  . INGUINAL HERNIA REPAIR     bilateral  . TONSILLECTOMY      Allergies:  Metoprolol tartrate Prior to Admit Meds:   Medications Prior to Admission  Medication Sig Dispense Refill Last Dose  . Ascorbic Acid (VITAMIN C) 100 MG tablet Take 1,000 mg by mouth daily.   09/20/2017 at Unknown time  . B Complex Vitamins (VITAMIN B COMPLEX PO) Take 1 tablet by mouth daily.   09/21/2017 at Unknown time  . Cholecalciferol (VITAMIN D-1000 MAX ST) 1000 units tablet Take 1,000 Units by mouth daily.   09/21/2017 at Unknown time  . Co-Enzyme Q-10 30 MG CAPS Take 30 mg by mouth daily.   09/21/2017 at Unknown time  . DOCOSAHEXAENOIC ACID PO Take 1,000 mg by mouth daily.   09/20/2017 at Unknown time  . Lactobacillus (PROBIOTIC ACIDOPHILUS PO) Take 1 tablet by mouth daily.   09/20/2017 at Unknown time  . Magnesium Gluconate 550 MG TABS Take 550 mg by mouth daily.   09/21/2017 at Unknown time  . Milk Thistle Extract 175 MG TABS Take 175 mg by mouth daily.   09/20/2017 at Unknown time  . Omega-3 Fatty Acids (RA FISH OIL) 1000 MG CAPS Take 1,000 mg by mouth daily.  09/20/2017 at Unknown time  . vitamin E 400 UNIT capsule Take 400 Units by mouth daily.   09/21/2017 at Unknown time  . zinc gluconate 50 MG tablet Take 50 mg by mouth 3 (three) times a week.   09/20/2017 at Unknown time   Fam HX:    Family History  Problem Relation Age of Onset  . CAD Father   . Hyperlipidemia Father    Social HX:    Social History   Socioeconomic History  . Marital status: Married    Spouse name: Not on file  . Number of children: Not on file  . Years of education: Not on file  . Highest education level: Not on file  Occupational History  . Not on file  Social Needs  . Financial resource strain: Not on file  . Food insecurity:    Worry: Not on file    Inability: Not on file  . Transportation needs:    Medical: Not on file    Non-medical: Not on file    Tobacco Use  . Smoking status: Never Smoker  . Smokeless tobacco: Never Used  Substance and Sexual Activity  . Alcohol use: Yes    Alcohol/week: 0.6 oz    Types: 1 Cans of beer per week  . Drug use: Not on file  . Sexual activity: Not on file  Lifestyle  . Physical activity:    Days per week: Not on file    Minutes per session: Not on file  . Stress: Not on file  Relationships  . Social connections:    Talks on phone: Not on file    Gets together: Not on file    Attends religious service: Not on file    Active member of club or organization: Not on file    Attends meetings of clubs or organizations: Not on file    Relationship status: Not on file  . Intimate partner violence:    Fear of current or ex partner: Not on file    Emotionally abused: Not on file    Physically abused: Not on file    Forced sexual activity: Not on file  Other Topics Concern  . Not on file  Social History Narrative  . Not on file     ROS:  All  ROS were addressed and are negative except what is stated in the HPI  Physical Exam: Blood pressure (!) 140/101, pulse 73, temperature 98 F (36.7 C), temperature source Oral, resp. rate 20, height 6' (1.829 m), weight 154 lb 6.4 oz (70 kg), SpO2 99 %.    General: Well developed, well nourished, in no acute distress Head: Eyes PERRLA, No xanthomas.   Normal cephalic and atramatic  Lungs:   Clear bilaterally to auscultation and percussion. Heart:   Irregularly irregular S1 S2 Pulses are 2+ & equal.            No carotid bruit. No JVD.  No abdominal bruits. No femoral bruits. Abdomen: Bowel sounds are positive, abdomen soft and non-tender without masses or                  Hernia's noted. Msk:  Back normal, normal gait. Normal strength and tone for age. Extremities:   No clubbing, cyanosis or edema.  DP +1 Neuro: Alert and oriented X 3. Psych:  Good affect, responds appropriately    Labs:   Lab Results  Component Value Date   WBC 5.5 09/22/2017    HGB 14.2 09/22/2017  HCT 43.1 09/22/2017   MCV 90.7 09/22/2017   PLT 117 (L) 09/22/2017    Recent Labs  Lab 09/22/17 0832  NA 140  K 3.4*  CL 101  CO2 28  BUN 17  CREATININE 1.26*  CALCIUM 9.0  PROT 5.9*  BILITOT 2.1*  ALKPHOS 72  ALT 53*  AST 46*  GLUCOSE 149*   No results found for: PTT Lab Results  Component Value Date   INR 1.14 09/21/2017   Lab Results  Component Value Date   TROPONINI 0.16 ([place]) 09/22/2017    No results found for: CHOL No results found for: HDL No results found for: LDLCALC No results found for: TRIG No results found for: CHOLHDL No results found for: LDLDIRECT    Radiology:  Dg Chest 2 View  Result Date: 09/21/2017 CLINICAL DATA:  Shortness of breath, tachycardia EXAM: CHEST - 2 VIEW COMPARISON:  None. FINDINGS: Cardiomegaly. There is hyperinflation of the lungs compatible with COPD. Small bilateral effusions. Minimal bibasilar atelectasis. No overt edema. No acute bony abnormality. IMPRESSION: COPD.  Cardiomegaly. Small bilateral effusions with bibasilar atelectasis. Electronically Signed   By: Rolm Baptise M.D.   On: 09/21/2017 16:13     Telemetry    Atrial fibrillation - Personally Reviewed  ECG    Atrial Fibrillation with RVR to 113 beats per with nonspecific ST-T wave abnormality in the lateral precordial leads- Personally Reviewed   ASSESSMENT/PLAN:   1.  New onset atrial fibrillation with RVR -this was diagnosed several days ago by his PCP but likely has been going on for several weeks given his symptoms and he has had intermittent palpitations in the past -TSH is normal -Heart rate improved and 73 bpm currently. -He has a history of alcohol use but now only drinking 1 beer weekly -Continue apixaban 5 mg twice daily for CHADS2VASC 4 (age > 89, HTN, CHF) -Continue carvedilol as he has an apparent allergy to metoprolol which caused significant nausea -2D echo pending  2.  Acute CHF -2D echocardiogram pending to determine  whether this is systolic or diastolic heart failure -Likely diastolic CHF exacerbated by A. fib with RVR -Cxray with small bilateral pleural effusions and BNP elevated at 530 -He received 20 mg IV Lasix last night -He put out 2.6 L overnight and is net -2.75 L -Weight is down 4 pounds from yesterday -SOB has resolved and he was able to lay flat to sleep for the first time last night  3.  Elevated troponin -Troponin mildly elevated but flat trend (0.11>0.17>0.16) and likely related to demand ischemia in the setting of A. fib with RVR, CHF and AKI. -Check 2D echo to assess LV function -If LV function is normal then consider outpt Lexiscan Myoview  to rule out ischemia since he does have cardiac risk factors including HTN and family history of CAD.  4.  Hypertension -BP remains elevated -increase carvedilol to 6.25mg  BID  5.  Elevated LFTs -LFTs elevated and likely related to passive congestion from CHF but could also be due to history of chronic alcohol use -continue to follow  6.  Acute on chronic kidney disease stage 2 -Creatinine 2012 was 1.25 -Creatinine elevated at 1.43 on admission but back down to 1.26 today after diuresis  7.  Hypokalemia -Replete K+ to keep > 4. -Repeat bmet in a.m.  8.  Elevated D-dimer -VQ scan to rule out PE   Fransico Him, MD  09/22/2017  2:09 PM

## 2017-09-22 NOTE — Progress Notes (Signed)
  Echocardiogram 2D Echocardiogram has been performed.  Jennette Dubin 09/22/2017, 4:02 PM

## 2017-09-22 NOTE — Progress Notes (Signed)
Nurse tech walked into room pt choking on meat  hemelic manever preformed on pt. Pt able to talk wife at the bedside.

## 2017-09-22 NOTE — Progress Notes (Signed)
PROGRESS NOTE    Austin Wong  YQM:[id number] DOB: 1940-02-28 DOA: 09/21/2017 PCP: No primary care provider on file.  Outpatient Specialists:   Brief Narrative:  Patient is a 77 year old Caucasian male with past medical history significant for hypertension.  Patient presented with sudden onset of shortness of breath.  On presentation, the patient was found to be in A. fib with rapid ventricular response (EKG revealed heart rate of 113 bpm).  Initial troponin was 0.011, peaked at 0.17, and the total was 0.16.  Cardiac BNP was elevated.  Chest x-ray revealed cardiomegaly with bilateral pleural effusion.  D-dimer is elevated, greater than 1.  Patient is back to sinus rhythm.  Echocardiogram is pending.  TSH is 4.472.  LFTs are minimally elevated.  Cardiology team has been consulted.  Proceed with VQ scan.  Serum creatinine is 1.26.  Patient was diuresed, but not currently on diuretics.  Shortness of breath has resolved.  Further management will depend on hospital course.   Assessment & Plan:   Active Problems:   Heart failure (HCC)   Atrial fibrillation with rapid ventricular response: -According to the patient, this is new onset.   -Currently, the patient is rate controlled. -Patient is back to sinus rhythm for now. -D-dimer is elevated -Troponin is minimally elevated -Patient is on Eliquis and Coreg. -Echo is pending. -Cardiology team has been consulted. -VQ scan has been ordered. -Further management will depend on hospital course.  Possible flash pulmonary edema/possible acute diastolic congestive heart failure:  -This could be related to rapid atrial fibrillation. -Brief diuresis done.  Patient is not currently on diuretics. -Patient seems euvolemic. -No shortness of breath. -We will await echocardiogram and cardiology input.  Hypertension: -Continue Coreg. -Depending on EF, will consider ACE inhibitor or ARB if the renal function permits. -Continue to optimize.  Elevated  troponin: -Likely related to A. fib with RVR and flash pulmonary edema. -No chest pain reported. -Currently, the patient is symptom-free.  DVT prophylaxis: on DOAC Code Status: full code Disposition Plan: Anticipate D/C home.  Consultants:   Cardiology   Procedures:   Awaiting echocardiogram  Antimicrobials:   None   Subjective: No chest pain No palpitation No syncope Shortness of breath No fever chills  Objective: Vitals:   09/22/17 0052 09/22/17 0500 09/22/17 0750 09/22/17 1219  BP:  110/80 (!) 136/107 (!) 140/101  Pulse: 82 64 69 73  Resp:  18 18 20   Temp:  97.8 F (36.6 C) 97.6 F (36.4 C) 98 F (36.7 C)  TempSrc:  Oral  Oral  SpO2: 92% 96% 96% 99%  Weight:  70 kg (154 lb 6.4 oz)    Height:        Intake/Output Summary (Last 24 hours) at 09/22/2017 1234 Last data filed at 09/22/2017 5284 Gross per 24 hour  Intake 120 ml  Output 2875 ml  Net -2755 ml   Filed Weights   09/21/17 1538 09/21/17 2038 09/22/17 0500  Weight: 71.7 kg (158 lb) 72 kg (158 lb 11.7 oz) 70 kg (154 lb 6.4 oz)    Examination:  General exam: Appears calm and comfortable  Respiratory system: Clear to auscultation. Respiratory effort normal. Cardiovascular system: S1 & S2 heard,  Gastrointestinal system: Abdomen is nondistended, soft and nontender. No organomegaly or masses felt. Normal bowel sounds heard. Central nervous system: Alert and oriented. No focal neurological deficits. Extremities: Symmetric 5 x 5 power.   Data Reviewed: I have personally reviewed following labs and imaging studies  CBC: Recent Labs  Lab  09/21/17 1541 09/22/17 0832  WBC 7.6 5.5  HGB 15.7 14.2  HCT 47.3 43.1  MCV 91.7 90.7  PLT 132* 093*   Basic Metabolic Panel: Recent Labs  Lab 09/21/17 1541 09/22/17 0832  NA 134* 140  K 4.1 3.4*  CL 100 101  CO2 24 28  GLUCOSE 159* 149*  BUN 18 17  CREATININE 1.43* 1.26*  CALCIUM 9.4 9.0   GFR: Estimated Creatinine Clearance: 47.8 mL/min (A)  (by C-G formula based on SCr of 1.26 mg/dL (H)). Liver Function Tests: Recent Labs  Lab 09/21/17 2137 09/22/17 0832  AST 53* 46*  ALT 57* 53*  ALKPHOS 79 72  BILITOT 1.8* 2.1*  PROT 5.9* 5.9*  ALBUMIN 3.4* 3.3*   No results for input(s): LIPASE, AMYLASE in the last 168 hours. No results for input(s): AMMONIA in the last 168 hours. Coagulation Profile: Recent Labs  Lab 09/21/17 2137  INR 1.14   Cardiac Enzymes: Recent Labs  Lab 09/21/17 2137 09/22/17 0832  TROPONINI 0.17* 0.16*   BNP (last 3 results) No results for input(s): PROBNP in the last 8760 hours. HbA1C: No results for input(s): HGBA1C in the last 72 hours. CBG: No results for input(s): GLUCAP in the last 168 hours. Lipid Profile: No results for input(s): CHOL, HDL, LDLCALC, TRIG, CHOLHDL, LDLDIRECT in the last 72 hours. Thyroid Function Tests: Recent Labs    09/22/17 0832  TSH 4.472   Anemia Panel: No results for input(s): VITAMINB12, FOLATE, FERRITIN, TIBC, IRON, RETICCTPCT in the last 72 hours. Urine analysis: No results found for: COLORURINE, APPEARANCEUR, LABSPEC, PHURINE, GLUCOSEU, HGBUR, BILIRUBINUR, KETONESUR, PROTEINUR, UROBILINOGEN, NITRITE, LEUKOCYTESUR Sepsis Labs: @LABRCNTIP (procalcitonin:4,lacticidven:4)  )No results found for this or any previous visit (from the past 240 hour(s)).       Radiology Studies: Dg Chest 2 View  Result Date: 09/21/2017 CLINICAL DATA:  Shortness of breath, tachycardia EXAM: CHEST - 2 VIEW COMPARISON:  None. FINDINGS: Cardiomegaly. There is hyperinflation of the lungs compatible with COPD. Small bilateral effusions. Minimal bibasilar atelectasis. No overt edema. No acute bony abnormality. IMPRESSION: COPD.  Cardiomegaly. Small bilateral effusions with bibasilar atelectasis. Electronically Signed   By: Rolm Baptise M.D.   On: 09/21/2017 16:13        Scheduled Meds: . apixaban  5 mg Oral BID  . carvedilol  3.125 mg Oral BID WC  . sodium chloride flush  3  mL Intravenous Q12H   Continuous Infusions:   LOS: 1 day    Time spent: 25 minutes    Dana Allan, MD  [facility] Pager #: [phone] 7PM-7AM contact night coverage as above

## 2017-09-23 DIAGNOSIS — J90 Pleural effusion, not elsewhere classified: Secondary | ICD-10-CM

## 2017-09-23 DIAGNOSIS — N183 Chronic kidney disease, stage 3 (moderate): Secondary | ICD-10-CM

## 2017-09-23 DIAGNOSIS — R0602 Shortness of breath: Secondary | ICD-10-CM

## 2017-09-23 LAB — RENAL FUNCTION PANEL
Albumin: 3.3 g/dL — ABNORMAL LOW (ref 3.5–5.0)
Anion gap: 11 (ref 5–15)
BUN: 17 mg/dL (ref 8–23)
CO2: 25 mmol/L (ref 22–32)
Calcium: 9.1 mg/dL (ref 8.9–10.3)
Chloride: 103 mmol/L (ref 98–111)
Creatinine, Ser: 1.15 mg/dL (ref 0.61–1.24)
GFR calc Af Amer: >60 mL/min (ref >60)
GFR calc non Af Amer: 59 mL/min — ABNORMAL LOW (ref >60)
Glucose, Bld: 113 mg/dL — ABNORMAL HIGH (ref 70–99)
Phosphorus: 3.5 mg/dL (ref 2.5–4.6)
Potassium: 3.7 mmol/L (ref 3.5–5.1)
Sodium: 139 mmol/L (ref 135–145)

## 2017-09-23 LAB — HEPATITIS PANEL, ACUTE
HCV Ab: <0.1 s/co ratio (ref 0.0–0.9)
Hep A IgM: NEGATIVE
Hep B C IgM: NEGATIVE
Hepatitis B Surface Ag: NEGATIVE

## 2017-09-23 MED ORDER — POTASSIUM CHLORIDE CRYS ER 20 MEQ PO TBCR
40.0000 meq | EXTENDED_RELEASE_TABLET | Freq: Once | ORAL | Status: AC
  Administered 2017-09-23: 40 meq via ORAL
  Filled 2017-09-23: qty 2

## 2017-09-23 MED ORDER — SODIUM CHLORIDE 0.9 % IV SOLN
INTRAVENOUS | Status: DC
  Administered 2017-09-24: 04:00:00 via INTRAVENOUS

## 2017-09-23 MED ORDER — SODIUM CHLORIDE 0.9% FLUSH: 3.0000 mL | INTRAVENOUS | Status: DC | PRN

## 2017-09-23 MED ORDER — SODIUM CHLORIDE 0.9% FLUSH
3.0000 mL | Freq: Two times a day (BID) | INTRAVENOUS | Status: DC
  Administered 2017-09-23 – 2017-09-24 (×2): 3 mL via INTRAVENOUS

## 2017-09-23 MED ORDER — HYDRALAZINE HCL 20 MG/ML IJ SOLN: 10.0000 mg | Freq: Four times a day (QID) | INTRAMUSCULAR | Status: DC | PRN

## 2017-09-23 MED ORDER — ASPIRIN 81 MG PO CHEW
81.0000 mg | CHEWABLE_TABLET | ORAL | Status: AC
  Administered 2017-09-24: 81 mg via ORAL
  Filled 2017-09-23: qty 1

## 2017-09-23 MED ORDER — LIVING BETTER WITH HEART FAILURE BOOK
Freq: Once | Status: AC
  Administered 2017-09-23: 15:00:00

## 2017-09-23 MED ORDER — SODIUM CHLORIDE 0.9 % IV SOLN: 250.0000 mL | INTRAVENOUS | Status: DC | PRN

## 2017-09-23 NOTE — Progress Notes (Signed)
PROGRESS NOTE    Austin Wong  QQI:[id number] DOB: 03-15-1940 DOA: 09/21/2017 PCP: No primary care provider on file.  Outpatient Specialists:   Brief Narrative:  Patient is a 77 year old Caucasian male with past medical history significant for hypertension.  Patient presented with sudden onset of shortness of breath.  On presentation, the patient was found to be in A. fib with rapid ventricular response (EKG revealed heart rate of 113 bpm).  Initial troponin was 0.011, peaked at 0.17, and the total was 0.16.  Cardiac BNP was elevated.  Chest x-ray revealed cardiomegaly with bilateral pleural effusion.  D-dimer is elevated, greater than 1.  Patient is back to sinus rhythm.  Echocardiogram is pending.  TSH is 4.472.  LFTs are minimally elevated.  Cardiology team has been consulted.  Proceed with VQ scan.  Serum creatinine is 1.26.  Patient was diuresed, but not currently on diuretics.  Shortness of breath has resolved.  Further management will depend on hospital course.   Home meds:  - no Rx meds, all vitamins/ supplements    Assessment & Plan:   Active Problems:   Atrial fibrillation, new onset (HCC)   Elevated troponin   Acute congestive heart failure ([place])   Hypertension   Chronic kidney disease   Hospital Course:   Atrial fibrillation with rapid ventricular response: - according to the patient, this is new onset  - HR 115 on presentation w/ mild CHF/ effusoins on CXR and SOB  - much better now w/ rate control, sp IV lasix x 1; lungs clear SOB gone -  started on eliquis and coreg here; eliquis on hold for heart cath tomorrow -  per cardiology   Acute systolic congestive heart failure:  -This could be related to rapid atrial fibrillation -Brief diuresis done, much better clinically -ECHO shows low EF 25% - plan is for heart cath tomorrow  Hypertension: -Continue Coreg, per cardiology - bp's still up a bit, defer to cards  - prn IV hydralazine   Elevated  troponin: -Likely related to A. fib with RVR / acute CHF -No chest pain reported. -Currently, the patient is symptom-free.  Kelly Splinter MD [facility] pgr [phone] 07/21/2017, 9:25 AM  DVT prophylaxis: on DOAC Code Status: full code Disposition Plan: Anticipate D/C home.  Consultants:   Cardiology  Procedures:   Awaiting echocardiogram  Antimicrobials:   None   Subjective: Feeling much better overall since admission  Objective: Vitals:   09/22/17 2334 09/22/17 2335 09/23/17 0606 09/23/17 1153  BP: (!) 133/111 122/79 (!) 156/97 (!) 128/107  Pulse: 83 (!) 103 84 98  Resp: 16  18 18   Temp: 98.1 F (36.7 C)  97.9 F (36.6 C) (!) 97.5 F (36.4 C)  TempSrc: Oral  Oral Oral  SpO2: 96%  96% 96%  Weight:   70.4 kg (155 lb 1.6 oz)   Height:        Intake/Output Summary (Last 24 hours) at 09/23/2017 1719 Last data filed at 09/23/2017 1349 Gross per 24 hour  Intake 480 ml  Output 675 ml  Net -195 ml   Filed Weights   09/21/17 2038 09/22/17 0500 09/23/17 0606  Weight: 72 kg (158 lb 11.7 oz) 70 kg (154 lb 6.4 oz) 70.4 kg (155 lb 1.6 oz)    Examination:  General exam: Appears calm and comfortable  Respiratory system: Clear to auscultation. Respiratory effort normal. Cardiovascular system: S1 & S2 heard,  Gastrointestinal system: Abdomen is nondistended, soft and nontender. No organomegaly or masses  felt. Normal bowel sounds heard. Central nervous system: Alert and oriented. No focal neurological deficits. Extremities: Symmetric 5 x 5 power.   Data Reviewed: I have personally reviewed following labs and imaging studies  CBC: Recent Labs  Lab 09/21/17 1541 09/22/17 0832  WBC 7.6 5.5  HGB 15.7 14.2  HCT 47.3 43.1  MCV 91.7 90.7  PLT 132* 631*   Basic Metabolic Panel: Recent Labs  Lab 09/21/17 1541 09/22/17 0832 09/23/17 0612  NA 134* 140 139  K 4.1 3.4* 3.7  CL 100 101 103  CO2 24 28 25   GLUCOSE 159* 149* 113*  BUN 18 17 17    CREATININE 1.43* 1.26* 1.15  CALCIUM 9.4 9.0 9.1  PHOS  --   --  3.5   GFR: Estimated Creatinine Clearance: 52.7 mL/min (by C-G formula based on SCr of 1.15 mg/dL). Liver Function Tests: Recent Labs  Lab 09/21/17 2137 09/22/17 0832 09/23/17 0612  AST 53* 46*  --   ALT 57* 53*  --   ALKPHOS 79 72  --   BILITOT 1.8* 2.1*  --   PROT 5.9* 5.9*  --   ALBUMIN 3.4* 3.3* 3.3*   No results for input(s): LIPASE, AMYLASE in the last 168 hours. No results for input(s): AMMONIA in the last 168 hours. Coagulation Profile: Recent Labs  Lab 09/21/17 2137  INR 1.14   Cardiac Enzymes: Recent Labs  Lab 09/21/17 2137 09/22/17 0832  TROPONINI 0.17* 0.16*   BNP (last 3 results) No results for input(s): PROBNP in the last 8760 hours. HbA1C: No results for input(s): HGBA1C in the last 72 hours. CBG: No results for input(s): GLUCAP in the last 168 hours. Lipid Profile: No results for input(s): CHOL, HDL, LDLCALC, TRIG, CHOLHDL, LDLDIRECT in the last 72 hours. Thyroid Function Tests: Recent Labs    09/22/17 0832  TSH 4.472   Anemia Panel: No results for input(s): VITAMINB12, FOLATE, FERRITIN, TIBC, IRON, RETICCTPCT in the last 72 hours. Urine analysis: No results found for: COLORURINE, APPEARANCEUR, LABSPEC, PHURINE, GLUCOSEU, HGBUR, BILIRUBINUR, KETONESUR, PROTEINUR, UROBILINOGEN, NITRITE, LEUKOCYTESUR Sepsis Labs: @LABRCNTIP (procalcitonin:4,lacticidven:4)  )No results found for this or any previous visit (from the past 240 hour(s)).       Radiology Studies: Nm Pulmonary Perf And Vent  Result Date: 09/22/2017 CLINICAL DATA:  Sudden onset of shortness of breath. EXAM: NUCLEAR MEDICINE VENTILATION - PERFUSION LUNG SCAN TECHNIQUE: Ventilation images were obtained in multiple projections using inhaled aerosol Tc-46m DTPA. Perfusion images were obtained in multiple projections after intravenous injection of Tc-27m-MAA. RADIOPHARMACEUTICALS:  32.0 mCi of Tc-9m DTPA aerosol  inhalation and 4.37 mCi Tc37m-MAA IV COMPARISON:  Chest x-ray 09/21/2017 FINDINGS: Ventilation: Mildly heterogeneous distribution of the radiopharmaceutical but no obvious segmental defects. Perfusion: No wedge shaped peripheral perfusion defects to suggest acute pulmonary embolism. IMPRESSION: Negative V/Q scan for pulmonary embolism. Electronically Signed   By: Marijo Sanes M.D.   On: 09/22/2017 14:59        Scheduled Meds: . carvedilol  6.25 mg Oral BID WC  . sodium chloride flush  3 mL Intravenous Q12H   Continuous Infusions: . [START ON 09/24/2017] sodium chloride       LOS: 2 days    Time spent: 25 minutes    Dana Allan, MD  [facility] Pager #: [phone] 7PM-7AM contact night coverage as above

## 2017-09-23 NOTE — Consult Note (Signed)
[facility] CM Primary Care Navigator  09/23/2017  Austin Wong April 15, 1940 [id number]   Met with patientand wife Denny Peon) at the bedsideto identify possible discharge needs. Patientreports thathe was having "shortness of breath and feeling smothered" that led to this admission. . PatientendorsesDr.Fred Wilson with [facility] care provider.   [place] to obtainmedications withoutdifficulty.  Patient reportsmanaginghis ownmedications at home straight out of the containers.  Patientstatesthathe has beendrivingprior to admission butwife daughter Dory Larsen) or neighbors Juanda Crumble, [place] or [place])will be providingtransportationtohisdoctors' appointments if needed after discharge.   Patient reports livingwith wife who can be his primary caregiver at home if needed.   Anticipated discharge plan ishomeaccording topatient.  Patientand wife voiced understanding to call primary care provider'soffice whenhereturns home for a post discharge follow-up appointment within1- 2 weeksor sooner if needs arise.Patient letter (with PCP's contact number) was provided asareminder.  Explained topatientand wife regardingTHN CM services available for health management/ resourcesat home but both denied any needs or concerns at this time, andpatient had declinedTHN- CM serviceswhich includesEMMIcalls to follow-up with hisrecovery. Patient states that wife has been helping him manage his health conditions at home so far. Patient had expressed knowledge in managing HF and A-fib (new- since wife has the same condition as he is).  Patienthowever,expressed understanding to seekreferral to [facility] care managementfrom primary care providerifdeemed necessary and appropriate for any servicesin thefuture.   [facility] care management information provided for future needs  that may arise.   For additional questions please contact:  Edwena Felty A. Stylianos Stradling, BSN, RN-BC [facility] PRIMARY CARE Navigator Cell: [phone]

## 2017-09-23 NOTE — Progress Notes (Signed)
Progress Note  Patient Name: Austin Wong Date of Encounter: 09/23/2017  Primary Cardiologist: New to Dr. Radford Pax  Subjective   Patient is without CP or SOB complaints this morning. Feeling back to his usual self. Discussed echocardiogram results and findings of newly reduced EF of 20-25%. Patient was disappointed and unclear if he grasps the severity of his disease. He is leery of prescription medications. Discussed needing to evaluate his coronary anatomy further to determine possible causes of his newly reduced EF. When discussing ability to place stents if blockages were found, he states if there was a blockage, he would manage it himself with red yeast rice.   Inpatient Medications    Scheduled Meds: . apixaban  5 mg Oral BID  . carvedilol  6.25 mg Oral BID WC  . sodium chloride flush  3 mL Intravenous Q12H   Continuous Infusions:  PRN Meds:    Vital Signs    Vitals:   09/22/17 2126 09/22/17 2334 09/22/17 2335 09/23/17 0606  BP: 131/90 (!) 133/111 122/79 (!) 156/97  Pulse: 86 83 (!) 103 84  Resp: 16 16  18   Temp: 98.4 F (36.9 C) 98.1 F (36.7 C)  97.9 F (36.6 C)  TempSrc: Oral Oral  Oral  SpO2: 97% 96%  96%  Weight:    155 lb 1.6 oz (70.4 kg)  Height:        Intake/Output Summary (Last 24 hours) at 09/23/2017 0901 Last data filed at 09/23/2017 0820 Gross per 24 hour  Intake 360 ml  Output 1050 ml  Net -690 ml   Filed Weights   09/21/17 2038 09/22/17 0500 09/23/17 0606  Weight: 158 lb 11.7 oz (72 kg) 154 lb 6.4 oz (70 kg) 155 lb 1.6 oz (70.4 kg)    Telemetry    Atrial fibrillation with intermittent RVR episodes with HR up to 130s - Personally Reviewed  Physical Exam   GEN: Sitting on the edge of his hospital bed in no acute distress.   Neck: No JVD, no carotid bruits Cardiac: IRIR, no murmurs, rubs, or gallops.  Respiratory: Clear to auscultation bilaterally, no wheezes/ rales/ rhonchi GI: NABS, Soft, nontender, non-distended  MS: No edema; No  deformity. Neuro:  Nonfocal, moving all extremities spontaneously Psych: Normal affect   Labs    Chemistry Recent Labs  Lab 09/21/17 1541 09/21/17 2137 09/22/17 0832 09/23/17 0612  NA 134*  --  140 139  K 4.1  --  3.4* 3.7  CL 100  --  101 103  CO2 24  --  28 25  GLUCOSE 159*  --  149* 113*  BUN 18  --  17 17  CREATININE 1.43*  --  1.26* 1.15  CALCIUM 9.4  --  9.0 9.1  PROT  --  5.9* 5.9*  --   ALBUMIN  --  3.4* 3.3* 3.3*  AST  --  53* 46*  --   ALT  --  57* 53*  --   ALKPHOS  --  79 72  --   BILITOT  --  1.8* 2.1*  --   GFRNONAA 45*  --  53* 59*  GFRAA 53*  --  >60 >60  ANIONGAP 10  --  11 11     Hematology Recent Labs  Lab 09/21/17 1541 09/22/17 0832  WBC 7.6 5.5  RBC 5.16 4.75  HGB 15.7 14.2  HCT 47.3 43.1  MCV 91.7 90.7  MCH 30.4 29.9  MCHC 33.2 32.9  RDW 13.5 13.5  PLT  132* 117*    Cardiac Enzymes Recent Labs  Lab 09/21/17 2137 09/22/17 0832  TROPONINI 0.17* 0.16*    Recent Labs  Lab 09/21/17 1545  TROPIPOC 0.11*     BNP Recent Labs  Lab 09/21/17 1541  BNP 530.7*     DDimer  Recent Labs  Lab 09/22/17 1144  DDIMER 1.02*     Radiology    Dg Chest 2 View  Result Date: 09/21/2017 CLINICAL DATA:  Shortness of breath, tachycardia EXAM: CHEST - 2 VIEW COMPARISON:  None. FINDINGS: Cardiomegaly. There is hyperinflation of the lungs compatible with COPD. Small bilateral effusions. Minimal bibasilar atelectasis. No overt edema. No acute bony abnormality. IMPRESSION: COPD.  Cardiomegaly. Small bilateral effusions with bibasilar atelectasis. Electronically Signed   By: Rolm Baptise M.D.   On: 09/21/2017 16:13   Nm Pulmonary Perf And Vent  Result Date: 09/22/2017 CLINICAL DATA:  Sudden onset of shortness of breath. EXAM: NUCLEAR MEDICINE VENTILATION - PERFUSION LUNG SCAN TECHNIQUE: Ventilation images were obtained in multiple projections using inhaled aerosol Tc-56m DTPA. Perfusion images were obtained in multiple projections after intravenous  injection of Tc-44m-MAA. RADIOPHARMACEUTICALS:  32.0 mCi of Tc-55m DTPA aerosol inhalation and 4.37 mCi Tc55m-MAA IV COMPARISON:  Chest x-ray 09/21/2017 FINDINGS: Ventilation: Mildly heterogeneous distribution of the radiopharmaceutical but no obvious segmental defects. Perfusion: No wedge shaped peripheral perfusion defects to suggest acute pulmonary embolism. IMPRESSION: Negative V/Q scan for pulmonary embolism. Electronically Signed   By: Marijo Sanes M.D.   On: 09/22/2017 14:59    Cardiac Studies   Echo 09/22/17: Study Conclusions  - Left ventricle: The cavity size was normal. There was mild   concentric hypertrophy. Systolic function was severely reduced.   The estimated ejection fraction was in the range of 20% to 25%.   Severe diffuse hypokinesis. There is akinesis of the inferior   myocardium. There is akinesis of the mid-apicalinferolateral   myocardium. There is akinesis of the basalanteroseptal   myocardium. The study was not technically sufficient to allow   evaluation of LV diastolic dysfunction due to atrial   fibrillation. - Aortic valve: Trileaflet; normal thickness, mildly calcified   leaflets. There was mild to moderate regurgitation. - Aorta: Aortic root dimension: 38 mm (ED). - Aortic root: The aortic root was mildly dilated. - Mitral valve: There was mild regurgitation. - Left atrium: The atrium was severely dilated. - Right atrium: The atrium was massively dilated. - Pulmonary arteries: PA peak pressure: 36 mm Hg (S). - Pericardium, extracardiac: There was a left pleural effusion.  Impressions:  - The right ventricular systolic pressure was increased consistent   with mild pulmonary hypertension  Patient Profile     77 y.o. male with PMH of HTN and CKD stage 3, who presented with SOB and found to have new onset atrial fibrillation and CHF.  Assessment & Plan    1. New onset atrial fibrillation with RVR: presented with SOB and palpitations, recently  diagnosed with atrial fibrillation by PCP a few days prior to admission. Still with intermittent episodes of RVR this morning, however had not received his coreg yet. Echo yesterday revealed EF 20-25%.  - Continue coreg 6.25mg  BID - Eliquis 5mg  BID for CHADS2VASC 4 (age > 75, HTN, CHF) on hold this morning to determine next steps at evaluating new systolic CHF. If decision made not to pursue [facility], then will resume this morning.    2. Acute systolic CHF: patient presented with SOB. BNP 530. CXR with small bilateral pleural effusions. He was  diuresed with 20mg  IV lasix 7/27 with good UOP: -3.4L this admission and maintaining net negative status in the last 24 hours. Echo yesterday with EF 20-25%. He appears euvolemic on exam today.  - Continue coreg 6.25mg  BID for now - room for titration if HR continues to be intermittently elevated. - Would benefit from further ischemic evaluation including a R/LHC, however patient was uninterested in potential interventions if ischemic heart disease were discovered. Risks/benefits were discussed with patient at length - to be continued with MD. - Would benefit from addition of ACEi/ARB vs entresto going forward. Cr is 1.15 today which is likely his baseline   3. Elevated troponin: trop trend flat at 0.11>0.17>0.16; likely demand ischemia in the setting of Afib RVR, CHF, and AKI. No anginal complaints.  - Would benefit from further ischemic evaluation including a R/LHC, however patient was uninterested in potential interventions if ischemic heart disease were discovered. Risks/benefits were discussed with patient at length - to be continued with MD.  4. HTN: BP intermittently elevated. Coreg started/titrated this admission.  - Continue coreg 6.25mg  BID for now - room for titration if HR continues to be intermittently elevated. - Would benefit from addition of ACEi/ARB vs entresto going forward. Cr is 1.15 today which is likely his baseline   5. Elevated Ddimer: p/w  SOB. Ddimer elevated. V/Q scan was negative for PE.  - No further work-up necessary  6. Acute on chronic kidney disease stage 3: Cr 1.43 on admission, improved to 1.15 today - suspect this is his baseline - Continue to monitor     For questions or updates, please contact [place] Please consult [email or web address] for contact info under Cardiology/STEMI.      Signed, Abigail Butts, PA-C  09/23/2017, 9:01 AM   [phone]

## 2017-09-23 NOTE — Progress Notes (Addendum)
Benefit check in progress for Austin Wong [phone]   RE: Benefit check  Received: Today  Message Contents  Memory Argue CMA        # 6.  S/ [facility] @ [place]       # [phone] OPT- 2    ELIQUIS 5 MG BID  COVER- YES  CO-PAY- $ 45.00  TIER- 4 DRUG  PRIOR APPROVAL- NO   PREFERRED PHARMACY : YES  [facility]

## 2017-09-23 NOTE — Discharge Instructions (Signed)

## 2017-09-24 ENCOUNTER — Encounter (HOSPITAL_COMMUNITY)
Admission: EM | Disposition: A | Discharge status: Home / Self Care | Payer: Self-pay | Source: Home / Self Care | Attending: Nephrology

## 2017-09-24 DIAGNOSIS — I5021 Acute systolic (congestive) heart failure: Secondary | ICD-10-CM

## 2017-09-24 DIAGNOSIS — I10 Essential (primary) hypertension: Secondary | ICD-10-CM

## 2017-09-24 DIAGNOSIS — I5041 Acute combined systolic (congestive) and diastolic (congestive) heart failure: Secondary | ICD-10-CM

## 2017-09-24 HISTORY — PX: RIGHT/LEFT HEART CATH AND CORONARY ANGIOGRAPHY: CATH118266

## 2017-09-24 LAB — POCT I-STAT 3, VENOUS BLOOD GAS (G3P V)
Acid-Base Excess: 2 mmol/L (ref 0.0–2.0)
BICARBONATE: 26.5 mmol/L (ref 20.0–28.0)
O2 Saturation: 66 %
PCO2 VEN: 41.3 mmHg — AB (ref 44.0–60.0)
PO2 VEN: 34 mmHg (ref 32.0–45.0)
TCO2: 28 mmol/L (ref 22–32)
pH, Ven: 7.416 (ref 7.250–7.430)

## 2017-09-24 SURGERY — RIGHT/LEFT HEART CATH AND CORONARY ANGIOGRAPHY
Anesthesia: LOCAL

## 2017-09-24 MED ORDER — FENTANYL CITRATE (PF) 100 MCG/2ML IJ SOLN
INTRAMUSCULAR | Status: AC
  Filled 2017-09-24: qty 2

## 2017-09-24 MED ORDER — HEPARIN SODIUM (PORCINE) 1000 UNIT/ML IJ SOLN
INTRAMUSCULAR | Status: DC | PRN
  Administered 2017-09-24: 3500 [IU] via INTRAVENOUS

## 2017-09-24 MED ORDER — HEPARIN (PORCINE) IN NACL 1000-0.9 UT/500ML-% IV SOLN
INTRAVENOUS | Status: DC | PRN
  Administered 2017-09-24 (×2): 500 mL

## 2017-09-24 MED ORDER — FENTANYL CITRATE (PF) 100 MCG/2ML IJ SOLN
INTRAMUSCULAR | Status: DC | PRN
  Administered 2017-09-24: 25 ug via INTRAVENOUS

## 2017-09-24 MED ORDER — SODIUM CHLORIDE 0.9% FLUSH: 3.0000 mL | INTRAVENOUS | Status: DC | PRN

## 2017-09-24 MED ORDER — ONDANSETRON HCL 4 MG/2ML IJ SOLN: 4.0000 mg | Freq: Four times a day (QID) | INTRAMUSCULAR | Status: DC | PRN

## 2017-09-24 MED ORDER — LIDOCAINE HCL (PF) 1 % IJ SOLN
INTRAMUSCULAR | Status: DC | PRN
  Administered 2017-09-24: 2 mL
  Administered 2017-09-24: 1 mL

## 2017-09-24 MED ORDER — IOHEXOL 350 MG/ML SOLN
INTRAVENOUS | Status: DC | PRN
  Administered 2017-09-24: 95 mL via INTRA_ARTERIAL

## 2017-09-24 MED ORDER — APIXABAN 5 MG PO TABS
5.0000 mg | ORAL_TABLET | Freq: Two times a day (BID) | ORAL | Status: DC
  Administered 2017-09-24 – 2017-09-25 (×2): 5 mg via ORAL
  Filled 2017-09-24 (×2): qty 1

## 2017-09-24 MED ORDER — SODIUM CHLORIDE 0.9 % IV SOLN: 250.0000 mL | INTRAVENOUS | Status: DC | PRN

## 2017-09-24 MED ORDER — MIDAZOLAM HCL 2 MG/2ML IJ SOLN
INTRAMUSCULAR | Status: DC | PRN
  Administered 2017-09-24: 1 mg via INTRAVENOUS

## 2017-09-24 MED ORDER — SODIUM CHLORIDE 0.9% FLUSH
3.0000 mL | Freq: Two times a day (BID) | INTRAVENOUS | Status: DC
  Administered 2017-09-25: 3 mL via INTRAVENOUS

## 2017-09-24 MED ORDER — HEPARIN SODIUM (PORCINE) 1000 UNIT/ML IJ SOLN
INTRAMUSCULAR | Status: AC
  Filled 2017-09-24: qty 1

## 2017-09-24 MED ORDER — VERAPAMIL HCL 2.5 MG/ML IV SOLN
INTRAVENOUS | Status: DC | PRN
  Administered 2017-09-24: 10 mL via INTRA_ARTERIAL

## 2017-09-24 MED ORDER — HEPARIN (PORCINE) IN NACL 1000-0.9 UT/500ML-% IV SOLN
INTRAVENOUS | Status: AC
  Filled 2017-09-24: qty 1000

## 2017-09-24 MED ORDER — MIDAZOLAM HCL 2 MG/2ML IJ SOLN
INTRAMUSCULAR | Status: AC
  Filled 2017-09-24: qty 2

## 2017-09-24 MED ORDER — VERAPAMIL HCL 2.5 MG/ML IV SOLN
INTRAVENOUS | Status: AC
  Filled 2017-09-24: qty 2

## 2017-09-24 MED ORDER — SODIUM CHLORIDE 0.9 % IV SOLN
INTRAVENOUS | Status: AC
  Administered 2017-09-24: 17:00:00 via INTRAVENOUS

## 2017-09-24 MED ORDER — ACETAMINOPHEN 325 MG PO TABS: 650.0000 mg | ORAL_TABLET | ORAL | Status: DC | PRN

## 2017-09-24 MED ORDER — LIDOCAINE HCL (PF) 1 % IJ SOLN
INTRAMUSCULAR | Status: AC
  Filled 2017-09-24: qty 30

## 2017-09-24 SURGICAL SUPPLY — 15 items
CATH BALLN WEDGE 5F 110CM (CATHETERS) ×2 IMPLANT
CATH INFINITI 5FR ANG PIGTAIL (CATHETERS) ×2 IMPLANT
CATH OPTITORQUE TIG 4.0 5F (CATHETERS) ×2 IMPLANT
DEVICE RAD COMP TR BAND LRG (VASCULAR PRODUCTS) ×2 IMPLANT
GLIDESHEATH SLEND SS 6F .021 (SHEATH) ×2 IMPLANT
GUIDEWIRE INQWIRE 1.5J.035X260 (WIRE) ×1 IMPLANT
INQWIRE 1.5J .035X260CM (WIRE) ×2
KIT HEART LEFT (KITS) ×2 IMPLANT
PACK CARDIAC CATHETERIZATION (CUSTOM PROCEDURE TRAY) ×2 IMPLANT
SHEATH GLIDE SLENDER 4/5FR (SHEATH) ×2 IMPLANT
SYR MEDRAD MARK V 150ML (SYRINGE) ×2 IMPLANT
TRANSDUCER W/STOPCOCK (MISCELLANEOUS) ×2 IMPLANT
TUBING CIL FLEX 10 FLL-RA (TUBING) ×2 IMPLANT
TUBING CONTRAST HIGH PRESS 20 (MISCELLANEOUS) ×2 IMPLANT
WIRE EMERALD 3MM-J .025X260CM (WIRE) ×2 IMPLANT

## 2017-09-24 NOTE — Progress Notes (Signed)
PROGRESS NOTE    Austin Wong  ZDG:[id number] DOB: [birth year] DOA: 09/21/2017 PCP: No primary care provider on file.  Outpatient Specialists:   Brief Narrative:  Patient is a 77 year old Caucasian male with past medical history significant for hypertension.  Patient presented with sudden onset of shortness of breath.  On presentation, the patient was found to be in A. fib with rapid ventricular response (EKG revealed heart rate of 113 bpm).  Initial troponin was 0.011, peaked at 0.17, and the total was 0.16.  Cardiac BNP was elevated.  Chest x-ray revealed cardiomegaly with bilateral pleural effusion.  D-dimer is elevated, greater than 1.  Patient is back to sinus rhythm.  Echocardiogram is pending.  TSH is 4.472.  LFTs are minimally elevated.  Cardiology team has been consulted.  Proceed with VQ scan.  Serum creatinine is 1.26.  Patient was diuresed, but not currently on diuretics.  Shortness of breath has resolved.  Further management will depend on hospital course.   Home meds:  - no Rx meds, all vitamins/ supplements    Assessment & Plan:   Active Problems:   Atrial fibrillation, new onset (HCC)   Elevated troponin   Acute congestive heart failure (HCC)   Hypertension   Chronic kidney disease   Pleural effusion   SOB (shortness of breath)   Hospital Course:   Atrial fibrillation with rapid ventricular response: - according to the patient, this is new onset  - HR 115 on presentation, now rate control good 70 - 90 bpm  - CHF/ acute pulm edema on admission- resolved now w/ diuretics and control of afib -  started on eliquis and coreg here; eliquis on hold for heart cath today -  per cardiology   Acute systolic congestive heart failure:  - ECHO shows new low EF 25% > heart cath planned today per cardiology - Brief diuresis done, much better clinically - appreciate cardiology assistance  Hypertension: -Continue Coreg - bp's still up a bit  - prn IV  hydralazine   Elevated troponin: -Likely related to A. fib with RVR / acute CHF -No chest pain reported. -Currently, the patient is symptom-free.  Kelly Splinter MD [facility] pgr [phone] 07/21/2017, 9:25 AM  DVT prophylaxis: on DOAC Code Status: full code Disposition Plan: to home Consultants:  Cardiology  Procedures:  echocardiogram L heart cath 7/30 Antimicrobials:  none   Subjective:  Seen in room, no new c/o's.  Awaiting heart cath today.   Objective: Vitals:   09/24/17 1448 09/24/17 1453 09/24/17 1458 09/24/17 1528  BP: (!) 146/90 (!) 170/95  (!) 130/100  Pulse: 81 67 (!) 0   Resp: 18 19 (!) 0   Temp:      TempSrc:      SpO2: 94% 94% (!) 0%   Weight:      Height:        Intake/Output Summary (Last 24 hours) at 09/24/2017 1555 Last data filed at 09/24/2017 0900 Gross per 24 hour  Intake 489.69 ml  Output 775 ml  Net -285.31 ml   Filed Weights   09/22/17 0500 09/23/17 0606 09/24/17 0353  Weight: 70 kg (154 lb 6.4 oz) 70.4 kg (155 lb 1.6 oz) 70.9 kg (156 lb 4.8 oz)    Examination:  General exam: Appears calm and comfortable  Respiratory system: Clear to auscultation. Respiratory effort normal. Cardiovascular system: S1 & S2 heard,  Gastrointestinal system: Abdomen is nondistended, soft and nontender. No organomegaly or masses felt. Normal bowel sounds heard. Central  nervous system: Alert and oriented. No focal neurological deficits. Extremities: Symmetric 5 x 5 power.   Data Reviewed: I have personally reviewed following labs and imaging studies  CBC: Recent Labs  Lab 09/21/17 1541 09/22/17 0832  WBC 7.6 5.5  HGB 15.7 14.2  HCT 47.3 43.1  MCV 91.7 90.7  PLT 132* 767*   Basic Metabolic Panel: Recent Labs  Lab 09/21/17 1541 09/22/17 0832 09/23/17 0612  NA 134* 140 139  K 4.1 3.4* 3.7  CL 100 101 103  CO2 24 28 25   GLUCOSE 159* 149* 113*  BUN 18 17 17   CREATININE 1.43* 1.26* 1.15  CALCIUM 9.4 9.0 9.1  PHOS  --    --  3.5   GFR: Estimated Creatinine Clearance: 53.1 mL/min (by C-G formula based on SCr of 1.15 mg/dL). Liver Function Tests: Recent Labs  Lab 09/21/17 2137 09/22/17 0832 09/23/17 0612  AST 53* 46*  --   ALT 57* 53*  --   ALKPHOS 79 72  --   BILITOT 1.8* 2.1*  --   PROT 5.9* 5.9*  --   ALBUMIN 3.4* 3.3* 3.3*   No results for input(s): LIPASE, AMYLASE in the last 168 hours. No results for input(s): AMMONIA in the last 168 hours. Coagulation Profile: Recent Labs  Lab 09/21/17 2137  INR 1.14   Cardiac Enzymes: Recent Labs  Lab 09/21/17 2137 09/22/17 0832  TROPONINI 0.17* 0.16*   BNP (last 3 results) No results for input(s): PROBNP in the last 8760 hours. HbA1C: No results for input(s): HGBA1C in the last 72 hours. CBG: No results for input(s): GLUCAP in the last 168 hours. Lipid Profile: No results for input(s): CHOL, HDL, LDLCALC, TRIG, CHOLHDL, LDLDIRECT in the last 72 hours. Thyroid Function Tests: Recent Labs    09/22/17 0832  TSH 4.472   Anemia Panel: No results for input(s): VITAMINB12, FOLATE, FERRITIN, TIBC, IRON, RETICCTPCT in the last 72 hours. Urine analysis: No results found for: COLORURINE, APPEARANCEUR, LABSPEC, PHURINE, GLUCOSEU, HGBUR, BILIRUBINUR, KETONESUR, PROTEINUR, UROBILINOGEN, NITRITE, LEUKOCYTESUR Sepsis Labs: @LABRCNTIP (procalcitonin:4,lacticidven:4)  )No results found for this or any previous visit (from the past 240 hour(s)).       Radiology Studies: No results found.      Scheduled Meds: . apixaban  5 mg Oral BID  . carvedilol  6.25 mg Oral BID WC  . sodium chloride flush  3 mL Intravenous Q12H  . sodium chloride flush  3 mL Intravenous Q12H   Continuous Infusions: . sodium chloride    . sodium chloride       LOS: 3 days    Time spent: 25 minutes    Dana Allan, MD  [facility] Pager #: [phone] 7PM-7AM contact night coverage as above

## 2017-09-24 NOTE — Progress Notes (Signed)
Progress Note  Patient Name: Austin Wong Date of Encounter: 09/24/2017  Primary Cardiologist: New to Dr. Radford Pax  Subjective  Seen this AM just prior to cath. They are anxious but looking forward to having more information. No chest pain. Barely notices afib.  Inpatient Medications    Scheduled Meds: . carvedilol  6.25 mg Oral BID WC  . sodium chloride flush  3 mL Intravenous Q12H  . sodium chloride flush  3 mL Intravenous Q12H   Continuous Infusions: . sodium chloride    . sodium chloride 50 mL/hr at 09/24/17 0646   PRN Meds:    Vital Signs    Vitals:   09/23/17 1153 09/23/17 1931 09/24/17 0353 09/24/17 1155  BP: (!) 128/107 (!) 118/92 (!) 145/107 (!) 128/97  Pulse: 98 96 80 79  Resp: 18 18 16 16   Temp: (!) 97.5 F (36.4 C) 97.7 F (36.5 C) 97.8 F (36.6 C) 98.7 F (37.1 C)  TempSrc: Oral Oral Oral Oral  SpO2: 96% 96% 97% 97%  Weight:   156 lb 4.8 oz (70.9 kg)   Height:        Intake/Output Summary (Last 24 hours) at 09/24/2017 1203 Last data filed at 09/24/2017 0900 Gross per 24 hour  Intake 729.69 ml  Output 775 ml  Net -45.31 ml   Filed Weights   09/22/17 0500 09/23/17 0606 09/24/17 0353  Weight: 154 lb 6.4 oz (70 kg) 155 lb 1.6 oz (70.4 kg) 156 lb 4.8 oz (70.9 kg)    Telemetry    Atrial fibrillation - Personally Reviewed  Physical Exam   GEN: Sitting on the edge of his hospital bed in no acute distress.   Neck: No JVD, no carotid bruits Cardiac: IRIR, no murmurs, rubs, or gallops.  Respiratory: Clear to auscultation bilaterally, no wheezes/ rales/ rhonchi GI: NABS, Soft, nontender, non-distended  MS: No edema; No deformity. Neuro:  Nonfocal, moving all extremities spontaneously Psych: Normal affect   Labs    Chemistry Recent Labs  Lab 09/21/17 1541 09/21/17 2137 09/22/17 0832 09/23/17 0612  NA 134*  --  140 139  K 4.1  --  3.4* 3.7  CL 100  --  101 103  CO2 24  --  28 25  GLUCOSE 159*  --  149* 113*  BUN 18  --  17 17    CREATININE 1.43*  --  1.26* 1.15  CALCIUM 9.4  --  9.0 9.1  PROT  --  5.9* 5.9*  --   ALBUMIN  --  3.4* 3.3* 3.3*  AST  --  53* 46*  --   ALT  --  57* 53*  --   ALKPHOS  --  79 72  --   BILITOT  --  1.8* 2.1*  --   GFRNONAA 45*  --  53* 59*  GFRAA 53*  --  >60 >60  ANIONGAP 10  --  11 11     Hematology Recent Labs  Lab 09/21/17 1541 09/22/17 0832  WBC 7.6 5.5  RBC 5.16 4.75  HGB 15.7 14.2  HCT 47.3 43.1  MCV 91.7 90.7  MCH 30.4 29.9  MCHC 33.2 32.9  RDW 13.5 13.5  PLT 132* 117*    Cardiac Enzymes Recent Labs  Lab 09/21/17 2137 09/22/17 0832  TROPONINI 0.17* 0.16*    Recent Labs  Lab 09/21/17 1545  TROPIPOC 0.11*     BNP Recent Labs  Lab 09/21/17 1541  BNP 530.7*     DDimer  Recent Labs  Lab 09/22/17 1144  DDIMER 1.02*     Radiology    Nm Pulmonary Perf And Vent  Result Date: 09/22/2017 CLINICAL DATA:  Sudden onset of shortness of breath. EXAM: NUCLEAR MEDICINE VENTILATION - PERFUSION LUNG SCAN TECHNIQUE: Ventilation images were obtained in multiple projections using inhaled aerosol Tc-89m DTPA. Perfusion images were obtained in multiple projections after intravenous injection of Tc-20m-MAA. RADIOPHARMACEUTICALS:  32.0 mCi of Tc-30m DTPA aerosol inhalation and 4.37 mCi Tc70m-MAA IV COMPARISON:  Chest x-ray 09/21/2017 FINDINGS: Ventilation: Mildly heterogeneous distribution of the radiopharmaceutical but no obvious segmental defects. Perfusion: No wedge shaped peripheral perfusion defects to suggest acute pulmonary embolism. IMPRESSION: Negative V/Q scan for pulmonary embolism. Electronically Signed   By: Marijo Sanes M.D.   On: 09/22/2017 14:59    Cardiac Studies   Echo 09/22/17: Study Conclusions  - Left ventricle: The cavity size was normal. There was mild   concentric hypertrophy. Systolic function was severely reduced.   The estimated ejection fraction was in the range of 20% to 25%.   Severe diffuse hypokinesis. There is akinesis of the  inferior   myocardium. There is akinesis of the mid-apicalinferolateral   myocardium. There is akinesis of the basalanteroseptal   myocardium. The study was not technically sufficient to allow   evaluation of LV diastolic dysfunction due to atrial   fibrillation. - Aortic valve: Trileaflet; normal thickness, mildly calcified   leaflets. There was mild to moderate regurgitation. - Aorta: Aortic root dimension: 38 mm (ED). - Aortic root: The aortic root was mildly dilated. - Mitral valve: There was mild regurgitation. - Left atrium: The atrium was severely dilated. - Right atrium: The atrium was massively dilated. - Pulmonary arteries: PA peak pressure: 36 mm Hg (S). - Pericardium, extracardiac: There was a left pleural effusion.  Impressions:  - The right ventricular systolic pressure was increased consistent   with mild pulmonary hypertension  Patient Profile     77 y.o. male with PMH of HTN and CKD stage 3, who presented with SOB and found to have new onset atrial fibrillation and CHF.  Assessment & Plan    1. New onset atrial fibrillation with RVR: presented with SOB and palpitations, recently diagnosed with atrial fibrillation by PCP a few days prior to admission. .  - Continue coreg 6.25mg  BID - Eliquis 5mg  BID for CHADS2VASC 4 (age > 28, HTN, CHF) on hold for cath. Based on access, will plan to resume.  - rate controlled, holding on TEE/cardioversion as patient is asymptomatic and wishes to know results of cath first  2. Acute systolic CHF, HTN: appears euvolemic. New EF of 20-25% this admission. Etiology ischemic vs. Nonischemic (would suspect tachycardia-related) - Continue coreg 6.25mg  BID, HR and BP well controlled - discussed cath at length yesterday, patient pending this AM. Wants diagnostic only, wants to consider PCI in the future if indicated and understands that would require an additional procedure. Would need to continue to discuss DAPT/statin if CAD found as he  is hesitant about these. - Would benefit from addition of ACEi/ARB vs entresto going forward based on results of cath. He does not wish to pursue Entresto, would prefer beta blocker and ACEI/ARB. Will likely need to minimize medications for long term use.  3. Elevated troponin: trop trend flat at 0.11>0.17>0.16; likely demand ischemia in the setting of Afib RVR, CHF, and AKI. No anginal complaints.  - pending R/LHC to evaluate for ischemic component  4. Acute on chronic kidney disease stage 3: Cr 1.43 on admission,  improved to 1.15 yesterday- suspect this is his baseline - Continue to monitor post cath, check BMP daily.  For questions or updates, please contact [place] Please consult [email or web address] for contact info under Cardiology/STEMI.  TIME SPENT WITH PATIENT: 35 minutes of direct patient care. More than 50% of that time was spent on coordination of care and counseling regarding atrial fibrillation, cardiomyopathy, heart failure evaluation and management.  [name], MD, PhD [facility]

## 2017-09-24 NOTE — H&P (View-Only) (Signed)
Progress Note  Patient Name: Austin Wong Date of Encounter: 09/24/2017  Primary Cardiologist: New to Dr. Radford Pax  Subjective  Seen this AM just prior to cath. They are anxious but looking forward to having more information. No chest pain. Barely notices afib.  Inpatient Medications    Scheduled Meds: . carvedilol  6.25 mg Oral BID WC  . sodium chloride flush  3 mL Intravenous Q12H  . sodium chloride flush  3 mL Intravenous Q12H   Continuous Infusions: . sodium chloride    . sodium chloride 50 mL/hr at 09/24/17 0646   PRN Meds:    Vital Signs    Vitals:   09/23/17 1153 09/23/17 1931 09/24/17 0353 09/24/17 1155  BP: (!) 128/107 (!) 118/92 (!) 145/107 (!) 128/97  Pulse: 98 96 80 79  Resp: 18 18 16 16   Temp: (!) 97.5 F (36.4 C) 97.7 F (36.5 C) 97.8 F (36.6 C) 98.7 F (37.1 C)  TempSrc: Oral Oral Oral Oral  SpO2: 96% 96% 97% 97%  Weight:   156 lb 4.8 oz (70.9 kg)   Height:        Intake/Output Summary (Last 24 hours) at 09/24/2017 1203 Last data filed at 09/24/2017 0900 Gross per 24 hour  Intake 729.69 ml  Output 775 ml  Net -45.31 ml   Filed Weights   09/22/17 0500 09/23/17 0606 09/24/17 0353  Weight: 154 lb 6.4 oz (70 kg) 155 lb 1.6 oz (70.4 kg) 156 lb 4.8 oz (70.9 kg)    Telemetry    Atrial fibrillation - Personally Reviewed  Physical Exam   GEN: Sitting on the edge of his hospital bed in no acute distress.   Neck: No JVD, no carotid bruits Cardiac: IRIR, no murmurs, rubs, or gallops.  Respiratory: Clear to auscultation bilaterally, no wheezes/ rales/ rhonchi GI: NABS, Soft, nontender, non-distended  MS: No edema; No deformity. Neuro:  Nonfocal, moving all extremities spontaneously Psych: Normal affect   Labs    Chemistry Recent Labs  Lab 09/21/17 1541 09/21/17 2137 09/22/17 0832 09/23/17 0612  NA 134*  --  140 139  K 4.1  --  3.4* 3.7  CL 100  --  101 103  CO2 24  --  28 25  GLUCOSE 159*  --  149* 113*  BUN 18  --  17 17    CREATININE 1.43*  --  1.26* 1.15  CALCIUM 9.4  --  9.0 9.1  PROT  --  5.9* 5.9*  --   ALBUMIN  --  3.4* 3.3* 3.3*  AST  --  53* 46*  --   ALT  --  57* 53*  --   ALKPHOS  --  79 72  --   BILITOT  --  1.8* 2.1*  --   GFRNONAA 45*  --  53* 59*  GFRAA 53*  --  >60 >60  ANIONGAP 10  --  11 11     Hematology Recent Labs  Lab 09/21/17 1541 09/22/17 0832  WBC 7.6 5.5  RBC 5.16 4.75  HGB 15.7 14.2  HCT 47.3 43.1  MCV 91.7 90.7  MCH 30.4 29.9  MCHC 33.2 32.9  RDW 13.5 13.5  PLT 132* 117*    Cardiac Enzymes Recent Labs  Lab 09/21/17 2137 09/22/17 0832  TROPONINI 0.17* 0.16*    Recent Labs  Lab 09/21/17 1545  TROPIPOC 0.11*     BNP Recent Labs  Lab 09/21/17 1541  BNP 530.7*     DDimer  Recent Labs  Lab 09/22/17 1144  DDIMER 1.02*     Radiology    Nm Pulmonary Perf And Vent  Result Date: 09/22/2017 CLINICAL DATA:  Sudden onset of shortness of breath. EXAM: NUCLEAR MEDICINE VENTILATION - PERFUSION LUNG SCAN TECHNIQUE: Ventilation images were obtained in multiple projections using inhaled aerosol Tc-62m DTPA. Perfusion images were obtained in multiple projections after intravenous injection of Tc-22m-MAA. RADIOPHARMACEUTICALS:  32.0 mCi of Tc-54m DTPA aerosol inhalation and 4.37 mCi Tc97m-MAA IV COMPARISON:  Chest x-ray 09/21/2017 FINDINGS: Ventilation: Mildly heterogeneous distribution of the radiopharmaceutical but no obvious segmental defects. Perfusion: No wedge shaped peripheral perfusion defects to suggest acute pulmonary embolism. IMPRESSION: Negative V/Q scan for pulmonary embolism. Electronically Signed   By: Marijo Sanes M.D.   On: 09/22/2017 14:59    Cardiac Studies   Echo 09/22/17: Study Conclusions  - Left ventricle: The cavity size was normal. There was mild   concentric hypertrophy. Systolic function was severely reduced.   The estimated ejection fraction was in the range of 20% to 25%.   Severe diffuse hypokinesis. There is akinesis of the  inferior   myocardium. There is akinesis of the mid-apicalinferolateral   myocardium. There is akinesis of the basalanteroseptal   myocardium. The study was not technically sufficient to allow   evaluation of LV diastolic dysfunction due to atrial   fibrillation. - Aortic valve: Trileaflet; normal thickness, mildly calcified   leaflets. There was mild to moderate regurgitation. - Aorta: Aortic root dimension: 38 mm (ED). - Aortic root: The aortic root was mildly dilated. - Mitral valve: There was mild regurgitation. - Left atrium: The atrium was severely dilated. - Right atrium: The atrium was massively dilated. - Pulmonary arteries: PA peak pressure: 36 mm Hg (S). - Pericardium, extracardiac: There was a left pleural effusion.  Impressions:  - The right ventricular systolic pressure was increased consistent   with mild pulmonary hypertension  Patient Profile     77 y.o. male with PMH of HTN and CKD stage 3, who presented with SOB and found to have new onset atrial fibrillation and CHF.  Assessment & Plan    1. New onset atrial fibrillation with RVR: presented with SOB and palpitations, recently diagnosed with atrial fibrillation by PCP a few days prior to admission. .  - Continue coreg 6.25mg  BID - Eliquis 5mg  BID for CHADS2VASC 4 (age > 2, HTN, CHF) on hold for cath. Based on access, will plan to resume.  - rate controlled, holding on TEE/cardioversion as patient is asymptomatic and wishes to know results of cath first  2. Acute systolic CHF, HTN: appears euvolemic. New EF of 20-25% this admission. Etiology ischemic vs. Nonischemic (would suspect tachycardia-related) - Continue coreg 6.25mg  BID, HR and BP well controlled - discussed cath at length yesterday, patient pending this AM. Wants diagnostic only, wants to consider PCI in the future if indicated and understands that would require an additional procedure. Would need to continue to discuss DAPT/statin if CAD found as he  is hesitant about these. - Would benefit from addition of ACEi/ARB vs entresto going forward based on results of cath. He does not wish to pursue Entresto, would prefer beta blocker and ACEI/ARB. Will likely need to minimize medications for long term use.  3. Elevated troponin: trop trend flat at 0.11>0.17>0.16; likely demand ischemia in the setting of Afib RVR, CHF, and AKI. No anginal complaints.  - pending R/LHC to evaluate for ischemic component  4. Acute on chronic kidney disease stage 3: Cr 1.43 on admission,  improved to 1.15 yesterday- suspect this is his baseline - Continue to monitor post cath, check BMP daily.  For questions or updates, please contact [place] Please consult [email or web address] for contact info under Cardiology/STEMI.  TIME SPENT WITH PATIENT: 35 minutes of direct patient care. More than 50% of that time was spent on coordination of care and counseling regarding atrial fibrillation, cardiomyopathy, heart failure evaluation and management.  [name], MD, PhD [facility]

## 2017-09-24 NOTE — Interval H&P Note (Signed)
History and Physical Interval Note:  09/24/2017 1:53 PM  Damiano Round  has presented today for surgery, with the diagnosis of cm, + Troponin, Afib.   The various methods of treatment have been discussed with the patient and family. After consideration of risks, benefits and other options for treatment, the patient has consented to  Procedure(s): RIGHT/LEFT HEART CATH AND CORONARY ANGIOGRAPHY (N/A) as a surgical intervention .  The patient's history has been reviewed, patient examined, no change in status, stable for surgery.  I have reviewed the patient's chart and labs.  Questions were answered to the patient's satisfaction.     Glenetta Hew

## 2017-09-24 NOTE — Plan of Care (Signed)
  Problem: Safety: Goal: Ability to remain free from injury will improve Outcome: Progressing   Problem: Activity: Goal: Capacity to carry out activities will improve Outcome: Progressing   

## 2017-09-25 ENCOUNTER — Encounter (HOSPITAL_COMMUNITY): Payer: Self-pay | Admitting: Cardiology

## 2017-09-25 LAB — CBC
HEMATOCRIT: 43.4 % (ref 39.0–52.0)
Hemoglobin: 14.4 g/dL (ref 13.0–17.0)
MCH: 30.1 pg (ref 26.0–34.0)
MCHC: 33.2 g/dL (ref 30.0–36.0)
MCV: 90.6 fL (ref 78.0–100.0)
PLATELETS: 123 10*3/uL — AB (ref 150–400)
RBC: 4.79 MIL/uL (ref 4.22–5.81)
RDW: 13.6 % (ref 11.5–15.5)
WBC: 6 10*3/uL (ref 4.0–10.5)

## 2017-09-25 LAB — BASIC METABOLIC PANEL
Anion gap: 9 (ref 5–15)
BUN: 16 mg/dL (ref 8–23)
CHLORIDE: 104 mmol/L (ref 98–111)
CO2: 25 mmol/L (ref 22–32)
CREATININE: 1.13 mg/dL (ref 0.61–1.24)
Calcium: 9.2 mg/dL (ref 8.9–10.3)
GFR calc Af Amer: >60 mL/min (ref >60)
GFR calc non Af Amer: >60 mL/min (ref >60)
Glucose, Bld: 111 mg/dL — ABNORMAL HIGH (ref 70–99)
Potassium: 3.8 mmol/L (ref 3.5–5.1)
Sodium: 138 mmol/L (ref 135–145)

## 2017-09-25 LAB — POCT I-STAT 3, VENOUS BLOOD GAS (G3P V)
ACID-BASE DEFICIT: 1 mmol/L (ref 0.0–2.0)
Bicarbonate: 23.8 mmol/L (ref 20.0–28.0)
O2 SAT: 66 %
PO2 VEN: 35 mmHg (ref 32.0–45.0)
TCO2: 25 mmol/L (ref 22–32)
pCO2, Ven: 40.5 mmHg — ABNORMAL LOW (ref 44.0–60.0)
pH, Ven: 7.378 (ref 7.250–7.430)

## 2017-09-25 LAB — POCT I-STAT 3, ART BLOOD GAS (G3+)
Bicarbonate: 23.5 mmol/L (ref 20.0–28.0)
O2 Saturation: 95 %
PH ART: 7.45 (ref 7.350–7.450)
TCO2: 24 mmol/L (ref 22–32)
pCO2 arterial: 33.8 mmHg (ref 32.0–48.0)
pO2, Arterial: 74 mmHg — ABNORMAL LOW (ref 83.0–108.0)

## 2017-09-25 MED ORDER — CARVEDILOL 6.25 MG PO TABS: 6.2500 mg | ORAL_TABLET | Freq: Two times a day (BID) | ORAL | 3 refills | Status: DC

## 2017-09-25 MED ORDER — LISINOPRIL 5 MG PO TABS
2.5000 mg | ORAL_TABLET | Freq: Every day | ORAL | Status: DC
  Administered 2017-09-25: 2.5 mg via ORAL
  Filled 2017-09-25: qty 1

## 2017-09-25 MED ORDER — APIXABAN 5 MG PO TABS: 5.0000 mg | ORAL_TABLET | Freq: Two times a day (BID) | ORAL | 3 refills | Status: DC

## 2017-09-25 MED ORDER — LISINOPRIL 2.5 MG PO TABS: 2.5000 mg | ORAL_TABLET | Freq: Every day | ORAL | 3 refills | Status: DC

## 2017-09-25 NOTE — Progress Notes (Signed)
Patient d/c with family paper work given IV removed with extra pressure applied d/t bleeding.

## 2017-09-25 NOTE — Evaluation (Signed)
Physical Therapy Evaluation Patient Details Name: Austin Wong MRN: [id number] DOB: November 19, 1940 Today's Date: 09/25/2017   History of Present Illness  Pt is a 77 y.o. M with significant PMH of HTN and CKD stage 3, who presented with SOB and found to have new onset atrial fibrillation and CHF.  Clinical Impression  Patient evaluated by Physical Therapy with no further acute PT needs identified. Patient is independent at baseline and very active. Patient ambulating hallway distances without an assistive device with no difficulty or evidence of imbalance. HR 89 at rest and increased to 154 bpm during mobility; RN aware. Instructed patient on endurance strategies and exercise recommendations. All education has been completed and the patient has no further questions. No follow-up Physical Therapy or equipment needs. PT is signing off. Thank you for this referral.      Follow Up Recommendations No PT follow up    Equipment Recommendations  None recommended by PT    Recommendations for Other Services       Precautions / Restrictions Precautions Precaution Comments: watch HR Restrictions Weight Bearing Restrictions: No      Mobility  Bed Mobility               General bed mobility comments: OOB sitting in chair  Transfers Overall transfer level: Independent                  Ambulation/Gait Ambulation/Gait assistance: Modified independent (Device/Increase time)((increased time)) Gait Distance (Feet): 350 Feet Assistive device: None Gait Pattern/deviations: Step-through pattern;Decreased stride length;Decreased dorsiflexion - left;Decreased dorsiflexion - right     General Gait Details: Patient with slightly decreased gait speed and noted decreased heel strike at initial contact as well as decreased push off bilaterally. Pt states gait is baseline. Able to step over/around obstacles and perform head turns independently.  Stairs            Wheelchair Mobility     Modified Rankin (Stroke Patients Only)       Balance Overall balance assessment: Mild deficits observed, not formally tested                                           Pertinent Vitals/Pain Pain Assessment: No/denies pain    Home Living Family/patient expects to be discharged to:: Private residence Living Arrangements: Spouse/significant other Available Help at Discharge: Family Type of Home: House Home Access: Stairs to enter Entrance Stairs-Rails: Left Entrance Stairs-Number of Steps: 3 Home Layout: One level Home Equipment: None      Prior Function Level of Independence: Independent         Comments: Enjoys doing yard work     Journalist, newspaper        Extremity/Trunk Assessment   Upper Extremity Assessment Upper Extremity Assessment: Overall WFL for tasks assessed    Lower Extremity Assessment Lower Extremity Assessment: Overall WFL for tasks assessed    Cervical / Trunk Assessment Cervical / Trunk Assessment: Normal  Communication   Communication: No difficulties  Cognition Arousal/Alertness: Awake/alert Behavior During Therapy: WFL for tasks assessed/performed Overall Cognitive Status: Within Functional Limits for tasks assessed                                        General Comments General comments (skin integrity, edema, etc.): Patient wife  and daughter present during session    Exercises     Assessment/Plan    PT Assessment Patent does not need any further PT services  PT Problem List         PT Treatment Interventions      PT Goals (Current goals can be found in the Care Plan section)  Acute Rehab PT Goals Patient Stated Goal: return back to normal activities PT Goal Formulation: All assessment and education complete, DC therapy    Frequency     Barriers to discharge        Co-evaluation               AM-PAC PT "6 Clicks" Daily Activity  Outcome Measure Difficulty turning over in  bed (including adjusting bedclothes, sheets and blankets)?: None Difficulty moving from lying on back to sitting on the side of the bed? : None Difficulty sitting down on and standing up from a chair with arms (e.g., wheelchair, bedside commode, etc,.)?: None Help needed moving to and from a bed to chair (including a wheelchair)?: None Help needed walking in hospital room?: None Help needed climbing 3-5 steps with a railing? : None 6 Click Score: 24    End of Session   Activity Tolerance: Patient tolerated treatment well Patient left: in chair;with call bell/phone within reach;with family/visitor present Nurse Communication: Mobility status;Other (comment)(HR) PT Visit Diagnosis: Unsteadiness on feet (R26.81)    Time: 8546-2703 PT Time Calculation (min) (ACUTE ONLY): 13 min   Charges:   PT Evaluation $PT Eval Moderate Complexity: 1 Mod          Ellamae Sia, PT, DPT Acute Rehabilitation Services  Pager: [phone]   Willy Eddy 09/25/2017, 10:48 AM

## 2017-09-25 NOTE — Evaluation (Signed)
Occupational Therapy Evaluation Patient Details Name: Austin Wong MRN: [id number] DOB: [birth year] Today's Date: 09/25/2017    History of Present Illness Pt is a 77 y.o. M with significant PMH of HTN and CKD stage 3, who presented with SOB and found to have new onset atrial fibrillation and CHF.   Clinical Impression   PTA, pt was living with his wife and was independent. Currently, pt performing ADLs and functional mobility at Mod I level with increased time near baseline function. Answering al pt and family questions prior to dc home.  Reviewed no pushing, pulling, lifting with RUE for 48 hours during ADLs/IADLs. Recommend dc home once medically stable per physician. All acute OT needs met and will sign off. Thank you.     Follow Up Recommendations  No OT follow up;Supervision - Intermittent    Equipment Recommendations  None recommended by OT    Recommendations for Other Services       Precautions / Restrictions Precautions Precaution Comments: watch HR Restrictions Weight Bearing Restrictions: No      Mobility Bed Mobility               General bed mobility comments: In chair with family and RN in room  Transfers Overall transfer level: Independent                    Balance Overall balance assessment: Mild deficits observed, not formally tested                                         ADL either performed or assessed with clinical judgement   ADL Overall ADL's : Modified independent                                       General ADL Comments: Near baseline function. Mod I for increased time. Pt performing grooming, dressing, and functional mobility.      Vision Baseline Vision/History: Wears glasses Wears Glasses: At all times Patient Visual Report: No change from baseline       Perception     Praxis      Pertinent Vitals/Pain Pain Assessment: No/denies pain     Hand Dominance Right    Extremity/Trunk Assessment Upper Extremity Assessment Upper Extremity Assessment: Overall WFL for tasks assessed(Reviewing no pushing pulling lifting with RUE for 48 hours)   Lower Extremity Assessment Lower Extremity Assessment: Overall WFL for tasks assessed   Cervical / Trunk Assessment Cervical / Trunk Assessment: Normal   Communication Communication Communication: No difficulties   Cognition Arousal/Alertness: Awake/alert Behavior During Therapy: WFL for tasks assessed/performed Overall Cognitive Status: Within Functional Limits for tasks assessed                                     General Comments  Wife and daughter present     Exercises     Shoulder Instructions      Home Living Family/patient expects to be discharged to:: Private residence Living Arrangements: Spouse/significant other Available Help at Discharge: Family Type of Home: House Home Access: Stairs to enter Technical brewer of Steps: 3 Entrance Stairs-Rails: Left Home Layout: One level     Bathroom Shower/Tub: Occupational psychologist: Handicapped height  Home Equipment: None          Prior Functioning/Environment Level of Independence: Independent        Comments: Enjoys doing yard work        OT Problem List: Decreased activity tolerance      OT Treatment/Interventions:      OT Goals(Current goals can be found in the care plan section) Acute Rehab OT Goals Patient Stated Goal: return back to normal activities OT Goal Formulation: All assessment and education complete, DC therapy  OT Frequency:     Barriers to D/C:            Co-evaluation              AM-PAC PT "6 Clicks" Daily Activity     Outcome Measure Help from another person eating meals?: None Help from another person taking care of personal grooming?: None Help from another person toileting, which includes using toliet, bedpan, or urinal?: None Help from another person  bathing (including washing, rinsing, drying)?: None Help from another person to put on and taking off regular upper body clothing?: None Help from another person to put on and taking off regular lower body clothing?: None 6 Click Score: 24   End of Session Nurse Communication: Mobility status;Other (comment)(Blood on bandage for IV site)  Activity Tolerance: Patient tolerated treatment well Patient left: in chair;with call bell/phone within reach;with family/visitor present  OT Visit Diagnosis: Muscle weakness (generalized) (M62.81);Unsteadiness on feet (R26.81)                Time: 5638-9373 OT Time Calculation (min): 14 min Charges:  OT General Charges $OT Visit: 1 Visit OT Evaluation $OT Eval Low Complexity: [place], OTR/L Acute Rehab Pager: [phone] Office: [place] 09/25/2017, 1:24 PM

## 2017-09-25 NOTE — Discharge Summary (Signed)
Physician Discharge Summary  Jamarion [place] VVO:[id number] DOB: 08-Jul-1940 DOA: 09/21/2017  PCP: No primary care provider on file.  Admit date: 09/21/2017 Discharge date: 09/25/2017  Admitted From: Home  Disposition:  Home   Recommendations for Outpatient Follow-up:  1. Follow up with PCP in 1-2 weeks 2. Please obtain BMP/CBC in one week   Home Health: None  Equipment/Devices: None  Discharge Condition: Good  CODE STATUS: FULL Diet recommendation: Cardiac  Brief/Interim Summary: Mr. Villarruel is a 77 y.o. M with hypertension who presented with acute worsening of several months progressive dyspnea on exertion.    On presentation, the patient was found to be in A. fib with rapid ventricular response (EKG revealed heart rate of 113 bpm).  Initial troponin was 0.011, peaked at 0.17, and the total was 0.16.  Cardiac BNP was elevated.  Chest x-ray revealed cardiomegaly with bilateral pleural effusion.        Discharge Diagnoses:   Atrial fibrillation with rapid ventricular response: CHA2DS2-VASc Score = 4 (age, HTN, CHF).  Started on Eliquis and Coreg.  Rate controlled.  Symptoms improved.   New onset acute systolic congestive heart failure:  Echocardiogram obtained, showed new ejection fraction 25%.  No valvular disease.  Left heart cath showed diffuse distal disease, no target lesions.  Diuresed briefly with IV Lasix, symptoms improved.  Has follow up with Dr. Harrell Gave arranged.   Hypertension: Started on Coreg and lisinopril.  Elevated troponin: No ACS.  Demand ischemia in the setting of new CHF with reduced EF and atrial fibrillation.          Discharge Instructions  Discharge Instructions    Diet - low sodium heart healthy   Complete by:  As directed    Discharge instructions   Complete by:  As directed    From Dr. Loleta Books: You were admitted for new atrial fibrillation and congestive heart failure.  For the atrial fibrillation: Take apixaban/Eliquis 5  mg twice daily for blood thinner Take carvedilol/Coreg 6.25 mg twice daily for heart rate slowing  For the heart failure: Take lisinopril 2.5 mg daily Take carvedilol/Coreg 6.25 mg twice daily Weigh yourself every day on your home scale Buy a scale if you don't have one If your weight increases more than 5 lbs from your base weight, call Dr. Judeth Cornfield office.   Follow up with your primary care doctor in one week.  Have them check your kidney function in 1 week if Dr. Judeth Cornfield office hasn't done it first.   Increase activity slowly   Complete by:  As directed      Allergies as of 09/25/2017      Reactions   Metoprolol Tartrate Nausea Only      Medication List    TAKE these medications   apixaban 5 MG Tabs tablet Commonly known as:  ELIQUIS Take 1 tablet (5 mg total) by mouth 2 (two) times daily.   carvedilol 6.25 MG tablet Commonly known as:  COREG Take 1 tablet (6.25 mg total) by mouth 2 (two) times daily with a meal.   Co-Enzyme Q-10 30 MG Caps Take 30 mg by mouth daily.   DOCOSAHEXAENOIC ACID PO Take 1,000 mg by mouth daily.   lisinopril 2.5 MG tablet Commonly known as:  PRINIVIL,ZESTRIL Take 1 tablet (2.5 mg total) by mouth daily. Start taking on:  09/26/2017   Magnesium Gluconate 550 MG Tabs Take 550 mg by mouth daily.   Milk Thistle Extract 175 MG Tabs Take 175 mg by mouth daily.   PROBIOTIC  ACIDOPHILUS PO Take 1 tablet by mouth daily.   RA FISH OIL 1000 MG Caps Take 1,000 mg by mouth daily.   VITAMIN B COMPLEX PO Take 1 tablet by mouth daily.   vitamin C 100 MG tablet Take 1,000 mg by mouth daily.   VITAMIN D-1000 MAX ST 1000 units tablet Generic drug:  Cholecalciferol Take 1,000 Units by mouth daily.   vitamin E 400 UNIT capsule Take 400 Units by mouth daily.   zinc gluconate 50 MG tablet Take 50 mg by mouth 3 (three) times a week.      Follow-up Information    Dr. [name] Follow up on 10/04/2017.   Why:  Please  arrive 15 minutes early for your 9:40am post-hospital follow-up appointment Contact information: [facility] [address] [place] [address] [phone]         Allergies  Allergen Reactions  . Metoprolol Tartrate Nausea Only    Consultations:  Cardiology   Procedures/Studies: Dg Chest 2 View  Result Date: 09/21/2017 CLINICAL DATA:  Shortness of breath, tachycardia EXAM: CHEST - 2 VIEW COMPARISON:  None. FINDINGS: Cardiomegaly. There is hyperinflation of the lungs compatible with COPD. Small bilateral effusions. Minimal bibasilar atelectasis. No overt edema. No acute bony abnormality. IMPRESSION: COPD.  Cardiomegaly. Small bilateral effusions with bibasilar atelectasis. Electronically Signed   By: Rolm Baptise M.D.   On: 09/21/2017 16:13   Nm Pulmonary Perf And Vent  Result Date: 09/22/2017 CLINICAL DATA:  Sudden onset of shortness of breath. EXAM: NUCLEAR MEDICINE VENTILATION - PERFUSION LUNG SCAN TECHNIQUE: Ventilation images were obtained in multiple projections using inhaled aerosol Tc-47m DTPA. Perfusion images were obtained in multiple projections after intravenous injection of Tc-58m-MAA. RADIOPHARMACEUTICALS:  32.0 mCi of Tc-55m DTPA aerosol inhalation and 4.37 mCi Tc44m-MAA IV COMPARISON:  Chest x-ray 09/21/2017 FINDINGS: Ventilation: Mildly heterogeneous distribution of the radiopharmaceutical but no obvious segmental defects. Perfusion: No wedge shaped peripheral perfusion defects to suggest acute pulmonary embolism. IMPRESSION: Negative V/Q scan for pulmonary embolism. Electronically Signed   By: Marijo Sanes M.D.   On: 09/22/2017 14:59   Echocardiogram 7/28 Study Conclusions  - Left ventricle: The cavity size was normal. There was mild   concentric hypertrophy. Systolic function was severely reduced.   The estimated ejection fraction was in the range of 20% to 25%.   Severe diffuse hypokinesis. There is akinesis of the inferior    myocardium. There is akinesis of the mid-apicalinferolateral   myocardium. There is akinesis of the basalanteroseptal   myocardium. The study was not technically sufficient to allow   evaluation of LV diastolic dysfunction due to atrial   fibrillation. - Aortic valve: Trileaflet; normal thickness, mildly calcified   leaflets. There was mild to moderate regurgitation. - Aorta: Aortic root dimension: 38 mm (ED). - Aortic root: The aortic root was mildly dilated. - Mitral valve: There was mild regurgitation. - Left atrium: The atrium was severely dilated. - Right atrium: The atrium was massively dilated. - Pulmonary arteries: PA peak pressure: 36 mm Hg (S). - Pericardium, extracardiac: There was a left pleural effusion.   Left heart catheterization  There is severe left ventricular systolic dysfunction. The left ventricular ejection fraction is less than 25% by visual estimate.  LV end diastolic pressure is moderately elevated.  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  There is mild (2+) mitral regurgitation.  --------------------------CORONARY ANATOMY--------------------------------  Colon Flattery 1st Diag lesion is 90% stenosed.  Ost 2nd Diag lesion is 85% stenosed. Ost 2nd Diag  to 2nd Diag lesion is 70% stenosed.  Dist Cx lesion is 75% stenosed.  Prox LAD to Mid LAD lesion is 20% stenosed.   Angiographically minimal CAD involving small Diag/Ramus branches and apical Lateral Cx-OM Severely dilated RA and RV with only mildly elevated pulmonary pressures. Severely dilated left ventricle with severe LV dysfunction (EF estimated may be 15 to 20%, but only mildly reduced cardiac output of 4.56 by FICK        Subjective: Feeling well.  No orthopnea, dyspnea on exertion, palpitations.  No chest pain.  No fever, cough, sputum.  Discharge Exam: Vitals:   09/25/17 1039 09/25/17 1213  BP: (!) 131/107 (!) 128/101  Pulse: 72 63  Resp:  18  Temp:  97.6 F (36.4  C)  SpO2: 100% 97%   Vitals:   09/24/17 2106 09/25/17 0543 09/25/17 1039 09/25/17 1213  BP: (!) 150/90 (!) 109/94 (!) 131/107 (!) 128/101  Pulse:  71 72 63  Resp:  17  18  Temp:  97.8 F (36.6 C)  97.6 F (36.4 C)  TempSrc:  Oral  Oral  SpO2:  97% 100% 97%  Weight:  71.1 kg (156 lb 12.8 oz)    Height:        General: Pt is alert, awake, not in acute distress, sitting up in a chair Cardiovascular: RRR, S1/S2 +, no rubs, no gallops Respiratory: CTA bilaterally, no wheezing, no rhonchi Abdominal: Soft, NT, ND, bowel sounds + Extremities: no edema, no cyanosis    The results of significant diagnostics from this hospitalization (including imaging, microbiology, ancillary and laboratory) are listed below for reference.     Microbiology: No results found for this or any previous visit (from the past 240 hour(s)).   Labs: BNP (last 3 results) Recent Labs    09/21/17 1541  BNP 027.7*   Basic Metabolic Panel: Recent Labs  Lab 09/21/17 1541 09/22/17 0832 09/23/17 0612 09/25/17 0812  NA 134* 140 139 138  K 4.1 3.4* 3.7 3.8  CL 100 101 103 104  CO2 24 28 25 25   GLUCOSE 159* 149* 113* 111*  BUN 18 17 17 16   CREATININE 1.43* 1.26* 1.15 1.13  CALCIUM 9.4 9.0 9.1 9.2  PHOS  --   --  3.5  --    Liver Function Tests: Recent Labs  Lab 09/21/17 2137 09/22/17 0832 09/23/17 0612  AST 53* 46*  --   ALT 57* 53*  --   ALKPHOS 79 72  --   BILITOT 1.8* 2.1*  --   PROT 5.9* 5.9*  --   ALBUMIN 3.4* 3.3* 3.3*   No results for input(s): LIPASE, AMYLASE in the last 168 hours. No results for input(s): AMMONIA in the last 168 hours. CBC: Recent Labs  Lab 09/21/17 1541 09/22/17 0832 09/25/17 0812  WBC 7.6 5.5 6.0  HGB 15.7 14.2 14.4  HCT 47.3 43.1 43.4  MCV 91.7 90.7 90.6  PLT 132* 117* 123*   Cardiac Enzymes: Recent Labs  Lab 09/21/17 2137 09/22/17 0832  TROPONINI 0.17* 0.16*   BNP: Invalid input(s): POCBNP CBG: No results for input(s): GLUCAP in the last 168  hours. D-Dimer No results for input(s): DDIMER in the last 72 hours. Hgb A1c No results for input(s): HGBA1C in the last 72 hours. Lipid Profile No results for input(s): CHOL, HDL, LDLCALC, TRIG, CHOLHDL, LDLDIRECT in the last 72 hours. Thyroid function studies No results for input(s): TSH, T4TOTAL, T3FREE, THYROIDAB in the last 72 hours.  Invalid input(s): FREET3 Anemia work  up No results for input(s): VITAMINB12, FOLATE, FERRITIN, TIBC, IRON, RETICCTPCT in the last 72 hours. Urinalysis No results found for: COLORURINE, APPEARANCEUR, LABSPEC, [place], GLUCOSEU, HGBUR, BILIRUBINUR, KETONESUR, PROTEINUR, UROBILINOGEN, NITRITE, LEUKOCYTESUR Sepsis Labs Invalid input(s): PROCALCITONIN,  WBC,  LACTICIDVEN Microbiology No results found for this or any previous visit (from the past 240 hour(s)).   Time coordinating discharge: 25 minutes       SIGNED:   Edwin Dada, MD  [facility] 09/25/2017, 7:15 PM

## 2017-09-25 NOTE — Progress Notes (Signed)
Eliquis coupon card given to patient with explanation of usage. B Yukie Bergeron RN,MHA,BSN [phone] 

## 2017-09-25 NOTE — Progress Notes (Signed)
Called case manager in regard to pt needing eliquis card for new prescription  Hassan Rowan CM, at bedside now

## 2017-09-25 NOTE — Progress Notes (Addendum)
Progress Note  Patient Name: Austin Wong Date of Encounter: 09/25/2017  Primary Cardiologist: New to Dr. Harrell Gave (per patient preference)  Subjective  Doing well this AM. Spent extensive time discussing cath results, medication recommendations, options for afib, etc. All reviewed below. No chest pain, SOB. Barely notices afib. Did not notice the times when his afib went to ~120 over the last 24 hours.  Inpatient Medications    Scheduled Meds: . apixaban  5 mg Oral BID  . carvedilol  6.25 mg Oral BID WC  . lisinopril  2.5 mg Oral Daily  . sodium chloride flush  3 mL Intravenous Q12H  . sodium chloride flush  3 mL Intravenous Q12H   Continuous Infusions: . sodium chloride     PRN Meds:    Vital Signs    Vitals:   09/24/17 2017 09/24/17 2040 09/24/17 2106 09/25/17 0543  BP: (!) 123/99 (!) 142/96 (!) 150/90 (!) 109/94  Pulse: 79 92  71  Resp:    17  Temp:    97.8 F (36.6 C)  TempSrc:    Oral  SpO2:    97%  Weight:    156 lb 12.8 oz (71.1 kg)  Height:        Intake/Output Summary (Last 24 hours) at 09/25/2017 1028 Last data filed at 09/25/2017 0900 Gross per 24 hour  Intake 840 ml  Output 1175 ml  Net -335 ml   Filed Weights   09/23/17 0606 09/24/17 0353 09/25/17 0543  Weight: 155 lb 1.6 oz (70.4 kg) 156 lb 4.8 oz (70.9 kg) 156 lb 12.8 oz (71.1 kg)    Telemetry    Atrial fibrillation - Personally Reviewed  Physical Exam   GEN: Sitting in the chair in no acute distress.   Neck: No JVD, no carotid bruits Cardiac: irregularly irregular S1 and S2, no murmurs, rubs, or gallops.  Respiratory: Clear to auscultation bilaterally, no wheezes/ rales/ rhonchi GI: NABS, Soft, nontender, non-distended  MS: No edema; No deformity. Radial cath site c/d/i Neuro:  Nonfocal, moving all extremities spontaneously Psych: Normal affect   Labs    Chemistry Recent Labs  Lab 09/21/17 2137 09/22/17 0832 09/23/17 0612 09/25/17 0812  NA  --  140 139 138  K  --  3.4*  3.7 3.8  CL  --  101 103 104  CO2  --  28 25 25   GLUCOSE  --  149* 113* 111*  BUN  --  17 17 16   CREATININE  --  1.26* 1.15 1.13  CALCIUM  --  9.0 9.1 9.2  PROT 5.9* 5.9*  --   --   ALBUMIN 3.4* 3.3* 3.3*  --   AST 53* 46*  --   --   ALT 57* 53*  --   --   ALKPHOS 79 72  --   --   BILITOT 1.8* 2.1*  --   --   GFRNONAA  --  53* 59* >60  GFRAA  --  >60 >60 >60  ANIONGAP  --  11 11 9      Hematology Recent Labs  Lab 09/21/17 1541 09/22/17 0832 09/25/17 0812  WBC 7.6 5.5 6.0  RBC 5.16 4.75 4.79  HGB 15.7 14.2 14.4  HCT 47.3 43.1 43.4  MCV 91.7 90.7 90.6  MCH 30.4 29.9 30.1  MCHC 33.2 32.9 33.2  RDW 13.5 13.5 13.6  PLT 132* 117* 123*    Cardiac Enzymes Recent Labs  Lab 09/21/17 2137 09/22/17 0832  TROPONINI 0.17* 0.16*  Recent Labs  Lab 09/21/17 1545  TROPIPOC 0.11*     BNP Recent Labs  Lab 09/21/17 1541  BNP 530.7*     DDimer  Recent Labs  Lab 09/22/17 1144  DDIMER 1.02*     Radiology    No results found.  Cardiac Studies   Echo 09/22/17: Study Conclusions  - Left ventricle: The cavity size was normal. There was mild   concentric hypertrophy. Systolic function was severely reduced.   The estimated ejection fraction was in the range of 20% to 25%.   Severe diffuse hypokinesis. There is akinesis of the inferior   myocardium. There is akinesis of the mid-apicalinferolateral   myocardium. There is akinesis of the basalanteroseptal   myocardium. The study was not technically sufficient to allow   evaluation of LV diastolic dysfunction due to atrial   fibrillation. - Aortic valve: Trileaflet; normal thickness, mildly calcified   leaflets. There was mild to moderate regurgitation. - Aorta: Aortic root dimension: 38 mm (ED). - Aortic root: The aortic root was mildly dilated. - Mitral valve: There was mild regurgitation. - Left atrium: The atrium was severely dilated. - Right atrium: The atrium was massively dilated. - Pulmonary arteries: PA  peak pressure: 36 mm Hg (S). - Pericardium, extracardiac: There was a left pleural effusion.  Impressions:  - The right ventricular systolic pressure was increased consistent   with mild pulmonary hypertension  Cath 09/24/17  There is severe left ventricular systolic dysfunction. The left ventricular ejection fraction is less than 25% by visual estimate.  LV end diastolic pressure is moderately elevated.  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  There is mild (2+) mitral regurgitation.  --------------------------CORONARY ANATOMY--------------------------------  Colon Flattery 1st Diag lesion is 90% stenosed.  Ost 2nd Diag lesion is 85% stenosed. Ost 2nd Diag to 2nd Diag lesion is 70% stenosed.  Dist Cx lesion is 75% stenosed.  Prox LAD to Mid LAD lesion is 20% stenosed.   Angiographically minimal CAD involving small Diag/Ramus branches and apical Lateral Cx-OM Severely dilated RA and RV with only mildly elevated pulmonary pressures. Severely dilated left ventricle with severe LV dysfunction (EF estimated may be 15 to 20%, but only mildly reduced cardiac output of 4.56 by FICK   Patient will return to nursing unit for ongoing care.  TR band removal per protocol.    Okay to restart Eliquis tonight  No indication for antiplatelet therapy at this time.  No need for aspirin as he will be on full anticoagulation agent.  Patient Profile     77 y.o. male with PMH of HTN and CKD stage 3, who presented with SOB and found to have new onset atrial fibrillation and CHF. 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  Assessment & Plan    1. New onset atrial fibrillation with RVR: presented with SOB and palpitations, recently diagnosed with atrial fibrillation by PCP a few days prior to admission. .  - Continue coreg 6.25mg  BID - Eliquis 5mg  BID for CHADS2VASC 4 (age > 72, HTN, CHF). Restarted last night, tolerating well. Should get  card for 30 days free at time of discharge. -discussed option of TEE/CV as well as rate vs. Rhythm control strategy. Did note that he had episodes briefly where his heart rate went up to 120 bpm, but generally has been in the 70s. Wrote out his medication indications for him re: apixaban and stroke risk, and carvedilol for rate control. Did note that we can switch to metoprolol succinate if  needed as an outpatient. Would write for 30 day scripts with refills in case meds need to be adjusted. - he will monitor his heart rate and blood pressure daily to help guide management. If concern that he is still having intermittent RVR, we may place Holter as an outpatient. - did discuss that he might have increased bleeding on vitamin E with apixaban, he will stop this supplement.  2. Acute systolic CHF, HTN: appears euvolemic. New EF of 20-25% this admission. 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 - Continue coreg 6.25mg  BID, HR and BP well controlled - cath results above. No aspirin given that he is on apixaban. Did discuss recommendations for statin, he does not wish to take this or any other lipid medication. He plans to manage with supplements such as fish oil and red yeast rice. - discussed goal directed medical therapy options. He is not interested in entresto. Started a low dose of lisinopril. Wrote out the indications for his medications. - recheck echo at 3 mos to see if EF improved.  3. Elevated troponin: trop trend flat at 0.11>0.17>0.16; likely demand ischemia in the setting of Afib RVR, CHF, and AKI. No anginal complaints.  - no intervenable CAD, all small vessel stenosis  4. Acute on chronic kidney disease stage 3: Cr normalized, GFR >60, resolved.  For questions or updates, please contact [place] Please consult [email or web address] for contact info under Cardiology/STEMI. [facility] will sign off in anticipation of discharge today.    Medication Recommendations:  apixaban 5 mg BID (give card at discharge), carvedilol 6.25 BID, lisinopril 2.5 mg daily. Other recommendations (labs, testing, etc):  none Follow up as an outpatient: We will arrange follow up post discharge with me at [facility], at patient's request.  TIME SPENT WITH PATIENT: 60 minutes of direct patient care. More than 50% of that time was spent on coordination of care and counseling regarding atrial fibrillation, cardiomyopathy, heart failure evaluation and management. >30 minutes spent discussing discharge planning and outpatient management.  [name], MD, PhD [facility]

## 2017-09-25 NOTE — Progress Notes (Signed)
Pharmacist Heart Failure Core Measure Documentation  Assessment: Dellas Guard has an EF documented as 20-25% on 09/22/17 by echo.  Rationale: Heart failure patients with left ventricular systolic dysfunction (LVSD) and an EF < 40% should be prescribed an angiotensin converting enzyme inhibitor (ACEI) or angiotensin receptor blocker (ARB) at discharge unless a contraindication is documented in the medical record.  This patient is not currently on an ACEI or ARB for HF.  This note is being placed in the record in order to provide documentation that a contraindication to the use of these agents is present for this encounter.  ACE Inhibitor or Angiotensin Receptor Blocker is contraindicated (specify all that apply)  []   ACEI allergy AND ARB allergy []   Angioedema []   Moderate or severe aortic stenosis []   Hyperkalemia [x]   Hypotension []   Renal artery stenosis []   Worsening renal function, preexisting renal disease or dysfunction   Isaias Sakai, Pharm D PGY1 Pharmacy Resident  Phone [phone] 09/25/2017      8:31 AM

## 2017-10-02 ENCOUNTER — Ambulatory Visit: Payer: PPO | Admitting: Adult Health

## 2017-10-04 ENCOUNTER — Ambulatory Visit: Payer: PPO | Admitting: Cardiology

## 2017-10-04 ENCOUNTER — Encounter: Payer: Self-pay | Admitting: Cardiology

## 2017-10-04 VITALS — BP 118/88 | HR 77 | Ht 72.5 in | Wt 163.0 lb

## 2017-10-04 DIAGNOSIS — I5041 Acute combined systolic (congestive) and diastolic (congestive) heart failure: Secondary | ICD-10-CM

## 2017-10-04 DIAGNOSIS — Z79899 Other long term (current) drug therapy: Secondary | ICD-10-CM

## 2017-10-04 DIAGNOSIS — I4891 Unspecified atrial fibrillation: Secondary | ICD-10-CM | POA: Diagnosis not present

## 2017-10-04 NOTE — Progress Notes (Signed)
Cardiology Office Note:    Date:  10/04/2017   ID:  [name], DOB October 15, 1940, MRN [id number]  PCP:  Christain Sacramento, MD  Cardiologist:  [name], MD PhD  Referring MD: No ref. provider found   Chief Complaint  Patient presents with  . s/p Cath    pt c/o itching--states he does not think its from medicine, but is not sure; no other Sx.   History of Present Illness:    Austin Wong is a 77 y.o. male with a hx of newly diagnosed atrial fibrillation (presented with RVR) and acute systolic and diastolic heart failure. He was seen by me in the hospital but is a new patient to our practice. He presents today for post-discharge follow up.  Please see discharge summary from 09/25/17 for full details, and study information included below. Briefly, Austin Wong had a several month history of dyspnea on exertion. He presented with afib RVR and was found to have flatly elevated troponins, elevated BNP, and bilateral pleural effusion. During his hospitalization he had an echo which showed reduced ejection fraction. 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 He was diuresed and started on medications. Of note, please see my notes during his hospitalization--we had long, extensive conversations regarding guideline medical therapy. Austin Wong has strong personal beliefs regarding medications and supplements to manage his health. As per my notes, his decision regarding medications was to take anticoagulation for atrial fibrillation, rate control and systolic heart failure management with carvedilol, and heart failure management with lisinopril. He declined TEE/CV, aspirin, statin, or entresto.  He presents today with his wife for follow up. Overall he is doing well. He has been able to return to activity, such as maintaining his yard. He does still feel somewhat limited by his breathing when he  exerts himself strenuously. No chest pain, syncope, PND, orthopnea. Tolerating medications well. He did have several small pruritic spots on his legs after working in the yard. He thought they were bug bites but wanted to make sure it wasn't the medication.  He brings with him a list of vital signs he has taken since discharge:  7/31 112/74, HR 86 8/1 99/64, HR 81 8/2 110/73, HR 82 8/3 118/72, HR 78 8/4 118/78, HR 78 8/5 102/60, HR 69 8/6 117/79, HR 78 8/7 109/75, HR 77 8/8 112/74, HR 79  He notes that he "didn't like" his diastolic being 60 on 8/5, so he held one dose of his carvedilol. Denies any symptoms at the time.  He otherwise has been taking the medications. We reviewed treatment options. He had nausea on metoprolol in the past and prefers to stick with the current regimen, though he is optimistic that his repeat echo will be improved in the future.  Past Medical History:  Diagnosis Date  . Chronic kidney disease    Creatinine 1.2 in 2012  . Hypertension    Diet controlled in the past    Past Surgical History:  Procedure Laterality Date  . INGUINAL HERNIA REPAIR     bilateral  . RIGHT/LEFT HEART CATH AND CORONARY ANGIOGRAPHY N/A 09/24/2017   Procedure: RIGHT/LEFT HEART CATH AND CORONARY ANGIOGRAPHY;  Surgeon: Leonie Man, MD;  Location: [facility];  Service: Cardiovascular;  Laterality: N/A;  . TONSILLECTOMY      Current Medications: Current Outpatient Medications on File Prior to Visit  Medication Sig  . apixaban (  ELIQUIS) 5 MG TABS tablet Take 1 tablet (5 mg total) by mouth 2 (two) times daily.  . Ascorbic Acid (VITAMIN C) 100 MG tablet Take 1,000 mg by mouth daily.  . B Complex Vitamins (VITAMIN B COMPLEX PO) Take 1 tablet by mouth daily.  . carvedilol (COREG) 6.25 MG tablet Take 1 tablet (6.25 mg total) by mouth 2 (two) times daily with a meal.  . Cholecalciferol (VITAMIN D-1000 MAX ST) 1000 units tablet Take 1,000 Units by mouth daily.  Marland Kitchen Co-Enzyme  Q-10 30 MG CAPS Take 30 mg by mouth daily.  . Lactobacillus (PROBIOTIC ACIDOPHILUS PO) Take 1 tablet by mouth daily.  Marland Kitchen lisinopril (PRINIVIL,ZESTRIL) 2.5 MG tablet Take 1 tablet (2.5 mg total) by mouth daily.  . Magnesium Gluconate 550 MG TABS Take 550 mg by mouth daily.  . Milk Thistle Extract 175 MG TABS Take 175 mg by mouth daily.  . Omega-3 Fatty Acids (RA FISH OIL) 1000 MG CAPS Take 1,000 mg by mouth daily.  Marland Kitchen zinc gluconate 50 MG tablet Take 50 mg by mouth 3 (three) times a week.   No current facility-administered medications on file prior to visit.      Allergies:   Metoprolol tartrate   Social History   Socioeconomic History  . Marital status: Married    Spouse name: Not on file  . Number of children: Not on file  . Years of education: Not on file  . Highest education level: Not on file  Occupational History  . Not on file  Social Needs  . Financial resource strain: Not on file  . Food insecurity:    Worry: Not on file    Inability: Not on file  . Transportation needs:    Medical: Not on file    Non-medical: Not on file  Tobacco Use  . Smoking status: Never Smoker  . Smokeless tobacco: Never Used  Substance and Sexual Activity  . Alcohol use: Yes    Alcohol/week: 1.0 standard drinks    Types: 1 Cans of beer per week  . Drug use: Not on file  . Sexual activity: Not on file  Lifestyle  . Physical activity:    Days per week: Not on file    Minutes per session: Not on file  . Stress: Not on file  Relationships  . Social connections:    Talks on phone: Not on file    Gets together: Not on file    Attends religious service: Not on file    Active member of club or organization: Not on file    Attends meetings of clubs or organizations: Not on file    Relationship status: Not on file  Other Topics Concern  . Not on file  Social History Narrative  . Not on file     Family History: The patient's family history includes CAD in his father; Hyperlipidemia in his  father.  ROS:   Please see the history of present illness.  Additional pertinent ROS: Review of Systems  Constitutional: Negative for chills, diaphoresis, fever and weight loss.  HENT: Negative for ear pain and hearing loss.   Eyes: Negative for blurred vision and pain.  Respiratory: Positive for shortness of breath. Negative for cough and wheezing.   Cardiovascular: Negative for chest pain, palpitations, orthopnea, claudication, leg swelling and PND.  Gastrointestinal: Negative for abdominal pain, blood in stool and melena.  Genitourinary: Negative for dysuria and hematuria.  Musculoskeletal: Negative for joint pain and myalgias.  Skin: Positive for itching.  Negative for rash.  Neurological: Negative for focal weakness and loss of consciousness.  Endo/Heme/Allergies: Bruises/bleeds easily.   EKGs/Labs/Other Studies Reviewed:    The following studies were reviewed today: Echo 09/22/17 Left ventricle: The cavity size was normal. There was mild   concentric hypertrophy. Systolic function was severely reduced.   The estimated ejection fraction was in the range of 20% to 25%.   Severe diffuse hypokinesis. There is akinesis of the inferior   myocardium. There is akinesis of the mid-apicalinferolateral   myocardium. There is akinesis of the basalanteroseptal   myocardium. The study was not technically sufficient to allow   evaluation of LV diastolic dysfunction due to atrial   fibrillation. - Aortic valve: Trileaflet; normal thickness, mildly calcified   leaflets. There was mild to moderate regurgitation. - Aorta: Aortic root dimension: 38 mm (ED). - Aortic root: The aortic root was mildly dilated. - Mitral valve: There was mild regurgitation. - Left atrium: The atrium was severely dilated. - Right atrium: The atrium was massively dilated. - Pulmonary arteries: PA peak pressure: 36 mm Hg (S). - Pericardium, extracardiac: There was a left pleural effusion.  Impressions:  - The  right ventricular systolic pressure was increased consistent   with mild pulmonary hypertension.  Left and right heart cath 09/24/17  There is severe left ventricular systolic dysfunction. The left ventricular ejection fraction is less than 25% by visual estimate.  LV end diastolic pressure is moderately elevated.  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  There is mild (2+) mitral regurgitation.  --------------------------CORONARY ANATOMY--------------------------------  Colon Flattery 1st Diag lesion is 90% stenosed.  Ost 2nd Diag lesion is 85% stenosed. Ost 2nd Diag to 2nd Diag lesion is 70% stenosed.  Dist Cx lesion is 75% stenosed.  Prox LAD to Mid LAD lesion is 20% stenosed.   Angiographically minimal CAD involving small Diag/Ramus branches and apical Lateral Cx-OM Severely dilated RA and RV with only mildly elevated pulmonary pressures. Severely dilated left ventricle with severe LV dysfunction (EF estimated may be 15 to 20%, but only mildly reduced cardiac output of 4.56 by FICK  EKG:  EKG is ordered today.  The ekg ordered today demonstrates rate controlled atrial fibrillation with one aberrantly conducted beat  Recent Labs: 09/21/2017: B Natriuretic Peptide 530.7 09/22/2017: ALT 53; TSH 4.472 09/25/2017: BUN 16; Creatinine, Ser 1.13; Hemoglobin 14.4; Platelets 123; Potassium 3.8; Sodium 138  Recent Lipid Panel No results found for: CHOL, TRIG, HDL, CHOLHDL, VLDL, LDLCALC, LDLDIRECT  Physical Exam:    VS:  BP 118/88 (BP Location: Left Arm)   Pulse 77   Ht 6' 0.5" (1.842 m)   Wt 163 lb (73.9 kg)   BMI 21.80 kg/m     Wt Readings from Last 3 Encounters:  10/04/17 163 lb (73.9 kg)  09/25/17 156 lb 12.8 oz (71.1 kg)     GEN: Well nourished, well developed in no acute distress HEENT: Normal NECK: No JVD; No carotid bruits LYMPHATICS: No lymphadenopathy CARDIAC: irregularly irregular rhythm, normal S1 and S2, no murmurs, rubs, gallops. Radial and DP  pulses 2+ bilaterally. RESPIRATORY:  Clear to auscultation without rales, wheezing or rhonchi  ABDOMEN: Soft, non-tender, non-distended MUSCULOSKELETAL:  No edema; No deformity  SKIN: Warm and dry NEUROLOGIC:  Alert and oriented x 3 PSYCHIATRIC:  Normal affect   ASSESSMENT:    1. Acute combined systolic and diastolic congestive heart failure ([place])   2. Atrial fibrillation, new onset ([place])   3. Medication management    PLAN:  1. Atrial fibrillation CHA2DS2/VAS Stroke Risk Points =3  >= 2 Points: High Risk  1 - 1.99 Points: Medium Risk  0 Points: Low Risk      Points Metrics  1 Has Congestive Heart Failure:  Yes   0 Has Vascular Disease:  No   0 Has Hypertension:  No   2 Age:  31   0 Has Diabetes:  No   0 Had Stroke:  No  Had TIA:  No  Had thromboembolism:  No   0 Male:  No     We have discussed his risk for stroke extensively. He wishes to pursue apixaban for anticoagulation and carvedilol for rate control. He does not feel his afib. We have discussed TEE/CV or just CV after appropriate anticoagulation, and he declines. He declines medication for rhythm control. We will re-evaluate after his next echocardiogram.   2. Acute systolic and diastolic heart failure: diagnosed at this recent admission. Euvolemic today -see prior notes: re his preferences for GDMT. Willing to use carvedilol and lisinopril. Limited on uptitration due to blood pressure -repeat echo prior to 3 mos follow up to determine if EF has improved. -he has voiced that if his EF does improve, he would like to come off of the lisinopril. We have discussed the risks and benefits to this. If he does stop lisinopril in 3 mos, would try to uptitrate carvedilol. -had nausea on metoprolol.  Plan for follow up: 3 mos echo and visit  Medication Adjustments/Labs and Tests Ordered: Current medicines are reviewed at length with the patient today.  Concerns regarding medicines are outlined above.  Orders Placed This  Encounter  Procedures  . ECHOCARDIOGRAM COMPLETE   No orders of the defined types were placed in this encounter.   Patient Instructions  Medication Instructions:  Your physician recommends that you continue on your current medications as directed. Please refer to the Current Medication list given to you today.   Labwork: None ordered  Testing/Procedures: Your physician has requested that you have an echocardiogram. Echocardiography is a painless test that uses sound waves to create images of your heart. It provides your doctor with information about the size and shape of your heart and how well your heart's chambers and valves are working. This procedure takes approximately one hour. There are no restrictions for this procedure.  (To be performed at [facility] prior to your next follow up appointment)   Follow-Up: Your physician recommends that you schedule a follow-up appointment in: 3 months with Dr.Tonia Avino   Any Other Special Instructions Will Be Listed Below (If Applicable).     If you need a refill on your cardiac medications before your next appointment, please call your pharmacy.  Gerilyn Nestle, MD PhD 10/04/2017 6:22 PM    [place]

## 2017-10-04 NOTE — Patient Instructions (Signed)
Medication Instructions:  Your physician recommends that you continue on your current medications as directed. Please refer to the Current Medication list given to you today.   Labwork: None ordered  Testing/Procedures: Your physician has requested that you have an echocardiogram. Echocardiography is a painless test that uses sound waves to create images of your heart. It provides your doctor with information about the size and shape of your heart and how well your heart's chambers and valves are working. This procedure takes approximately one hour. There are no restrictions for this procedure.  (To be performed at [facility] prior to your next follow up appointment)   Follow-Up: Your physician recommends that you schedule a follow-up appointment in: 3 months with Dr.Christopher   Any Other Special Instructions Will Be Listed Below (If Applicable).     If you need a refill on your cardiac medications before your next appointment, please call your pharmacy.  Marland Kitchen

## 2017-10-09 NOTE — Addendum Note (Signed)
Addended by: Therisa Doyne on: 10/09/2017 11:01 AM   Modules accepted: Orders

## 2017-10-14 ENCOUNTER — Ambulatory Visit: Payer: PPO | Admitting: Physician Assistant

## 2017-12-02 ENCOUNTER — Ambulatory Visit (HOSPITAL_COMMUNITY)
Admission: RE | Admit: 2017-12-02 | Discharge: 2017-12-02 | Disposition: A | Discharge status: Home / Self Care | Payer: PPO | Source: Ambulatory Visit | Attending: Cardiology | Admitting: Cardiology

## 2017-12-02 DIAGNOSIS — I4891 Unspecified atrial fibrillation: Secondary | ICD-10-CM | POA: Diagnosis not present

## 2017-12-02 DIAGNOSIS — I371 Nonrheumatic pulmonary valve insufficiency: Secondary | ICD-10-CM | POA: Insufficient documentation

## 2017-12-02 DIAGNOSIS — I5041 Acute combined systolic (congestive) and diastolic (congestive) heart failure: Secondary | ICD-10-CM | POA: Insufficient documentation

## 2017-12-02 DIAGNOSIS — I351 Nonrheumatic aortic (valve) insufficiency: Secondary | ICD-10-CM | POA: Insufficient documentation

## 2017-12-02 DIAGNOSIS — I130 Hypertensive heart and chronic kidney disease with heart failure and stage 1 through stage 4 chronic kidney disease, or unspecified chronic kidney disease: Secondary | ICD-10-CM | POA: Diagnosis not present

## 2017-12-02 NOTE — Progress Notes (Signed)
  Echocardiogram 2D Echocardiogram has been performed.  Jannett Celestine 12/02/2017, 11:47 AM

## 2018-01-10 ENCOUNTER — Encounter: Payer: Self-pay | Admitting: Cardiology

## 2018-01-10 ENCOUNTER — Ambulatory Visit (INDEPENDENT_AMBULATORY_CARE_PROVIDER_SITE_OTHER): Payer: PPO | Admitting: Cardiology

## 2018-01-10 VITALS — BP 156/90 | HR 84 | Ht 72.0 in | Wt 165.2 lb

## 2018-01-10 DIAGNOSIS — I5022 Chronic systolic (congestive) heart failure: Secondary | ICD-10-CM | POA: Diagnosis not present

## 2018-01-10 DIAGNOSIS — I428 Other cardiomyopathies: Secondary | ICD-10-CM | POA: Diagnosis not present

## 2018-01-10 DIAGNOSIS — Z7189 Other specified counseling: Secondary | ICD-10-CM

## 2018-01-10 DIAGNOSIS — I10 Essential (primary) hypertension: Secondary | ICD-10-CM

## 2018-01-10 DIAGNOSIS — I4821 Permanent atrial fibrillation: Secondary | ICD-10-CM | POA: Diagnosis not present

## 2018-01-10 MED ORDER — LISINOPRIL 2.5 MG PO TABS: 2.5000 mg | ORAL_TABLET | Freq: Every day | ORAL | 3 refills | Status: DC

## 2018-01-10 NOTE — Patient Instructions (Signed)
Medication Instructions:  Restart: Lisinopril 2.5 mg daily If you need a refill on your cardiac medications before your next appointment, please call your pharmacy.   Lab work: None   Testing/Procedures: None  Follow-Up: At [facility], you and your health needs are our priority.  As part of our continuing mission to provide you with exceptional heart care, we have created designated Provider Care Teams.  These Care Teams include your primary Cardiologist (physician) and Advanced Practice Providers (APPs -  Physician Assistants and Nurse Practitioners) who all work together to provide you with the care you need, when you need it. You will need a follow up appointment in 6 months.  Please call our office 2 months in advance to schedule this appointment.  You may see Dr. Harrell Gave or one of the following Advanced Practice Providers on your designated Care Team:   Rosaria Ferries, PA-C . Jory Sims, DNP, ANP  Any Other Special Instructions Will Be Listed Below (If Applicable).

## 2018-01-10 NOTE — Progress Notes (Signed)
Cardiology Office Note:    Date:  01/10/2018   ID:  Austin Wong, DOB 1941/02/22, MRN [id number]  PCP:  Austin Sacramento, MD  Cardiologist:  Austin Dresser, MD PhD  Referring MD: Austin Sacramento, MD   No chief complaint on file.  History of Present Illness:    Austin Wong is a 77 y.o. male with a hx of recently diagnosed atrial fibrillation (presented with RVR) and acute systolic and diastolic heart failure 04/7626. He presents today for follow up.  Please see discharge summary from 09/25/17 for full details, and study information included below. Briefly, Austin Wong had a several month history of dyspnea on exertion. He presented with afib RVR and was found to have flatly elevated troponins, elevated BNP, and bilateral pleural effusion. During his hospitalization he had an echo which showed reduced ejection fraction. 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 He was diuresed and started on medications. Of note, please see my notes during his hospitalization--we had long, extensive conversations regarding guideline medical therapy. Austin Wong has strong personal beliefs regarding medications and supplements to manage his health. As per my notes, his decision regarding medications was to take anticoagulation for atrial fibrillation, rate control and systolic heart failure management with carvedilol, and heart failure management with lisinopril. He declined TEE/CV, aspirin, statin, or entresto.  His initial post hospitalization visit was on 10/04/17. Plan at that time was to titrate HF medications and recheck echo in 3 mos.  He presents today for follow up. Overall, he is doing well. His repeat echo showed that his EF had not normalized. He self discontinued his lisinopril since "it didn't fix my heart." He is tolerating apixaban, though notes the cost is problematic once they hit the donut  hole.  No chest pain, SOB, PND, orthopnea. No syncope or palpitations. Weights have been stable. He remains very active. The only time he notes decreased exercise tolerance is using his mower uphill.   Past Medical History:  Diagnosis Date  . Chronic kidney disease    Creatinine 1.2 in 2012  . Hypertension    Diet controlled in the past    Past Surgical History:  Procedure Laterality Date  . INGUINAL HERNIA REPAIR     bilateral  . RIGHT/LEFT HEART CATH AND CORONARY ANGIOGRAPHY N/A 09/24/2017   Procedure: RIGHT/LEFT HEART CATH AND CORONARY ANGIOGRAPHY;  Surgeon: Austin Man, MD;  Location: [facility];  Service: Cardiovascular;  Laterality: N/A;  . TONSILLECTOMY      Current Medications: Current Outpatient Medications on File Prior to Visit  Medication Sig  . apixaban (ELIQUIS) 5 MG TABS tablet Take 1 tablet (5 mg total) by mouth 2 (two) times daily.  . Ascorbic Acid (VITAMIN C) 100 MG tablet Take 1,000 mg by mouth daily.  . B Complex Vitamins (VITAMIN B COMPLEX PO) Take 1 tablet by mouth daily.  . carvedilol (COREG) 6.25 MG tablet Take 1 tablet (6.25 mg total) by mouth 2 (two) times daily with a meal.  . Cholecalciferol (VITAMIN D-1000 MAX ST) 1000 units tablet Take 1,000 Units by mouth daily.  Marland Kitchen Co-Enzyme Q-10 30 MG CAPS Take 30 mg by mouth daily.  . Lactobacillus (PROBIOTIC ACIDOPHILUS PO) Take 1 tablet by mouth daily.  . Magnesium Gluconate 550 MG TABS Take 550 mg by mouth daily.  . Milk Thistle Extract 175 MG TABS Take 175 mg by mouth daily.  Marland Kitchen  Omega-3 Fatty Acids (RA FISH OIL) 1000 MG CAPS Take 1,000 mg by mouth daily.  Marland Kitchen zinc gluconate 50 MG tablet Take 50 mg by mouth 3 (three) times a week.   No current facility-administered medications on file prior to visit.   he stopped taking lisinopril   Allergies:   Metoprolol tartrate   Social History   Socioeconomic History  . Marital status: Married    Spouse name: Not on file  . Number of children: Not on  file  . Years of education: Not on file  . Highest education level: Not on file  Occupational History  . Not on file  Social Needs  . Financial resource strain: Not on file  . Food insecurity:    Worry: Not on file    Inability: Not on file  . Transportation needs:    Medical: Not on file    Non-medical: Not on file  Tobacco Use  . Smoking status: Never Smoker  . Smokeless tobacco: Never Used  Substance and Sexual Activity  . Alcohol use: Yes    Alcohol/week: 1.0 standard drinks    Types: 1 Cans of beer per week  . Drug use: Not on file  . Sexual activity: Not on file  Lifestyle  . Physical activity:    Days per week: Not on file    Minutes per session: Not on file  . Stress: Not on file  Relationships  . Social connections:    Talks on phone: Not on file    Gets together: Not on file    Attends religious service: Not on file    Active member of club or organization: Not on file    Attends meetings of clubs or organizations: Not on file    Relationship status: Not on file  Other Topics Concern  . Not on file  Social History Narrative  . Not on file     Family History: The patient's family history includes CAD in his father; Hyperlipidemia in his father.  ROS:   Please see the history of present illness.  Additional pertinent ROS: Review of Systems  Constitutional: Negative for chills, diaphoresis, fever and weight loss.  HENT: Negative for ear pain and hearing loss.   Eyes: Negative for blurred vision and pain.  Respiratory: Negative for cough, shortness of breath and wheezing.   Cardiovascular: Negative for chest pain, palpitations, orthopnea, claudication, leg swelling and PND.  Gastrointestinal: Negative for abdominal pain, blood in stool and melena.  Genitourinary: Negative for dysuria and hematuria.  Musculoskeletal: Negative for joint pain and myalgias.  Skin: Negative for itching and rash.  Neurological: Negative for focal weakness and loss of  consciousness.  Endo/Heme/Allergies: Bruises/bleeds easily.   EKGs/Labs/Other Studies Reviewed:    The following studies were reviewed today:  Echo 12/02/17 Study Conclusions  - Left ventricle: Wall thickness was increased in a pattern of   severe LVH. Systolic function was moderately to severely reduced.   The estimated ejection fraction was in the range of 30% to 35%.   Diffuse hypokinesis. - Aortic valve: There was mild regurgitation. - Left atrium: The atrium was mildly dilated. - Right ventricle: Systolic function was moderately reduced. - Right atrium: The atrium was mildly to moderately dilated.  Echo 09/22/17 Left ventricle: The cavity size was normal. There was mild   concentric hypertrophy. Systolic function was severely reduced.   The estimated ejection fraction was in the range of 20% to 25%.   Severe diffuse hypokinesis. There is akinesis of  the inferior   myocardium. There is akinesis of the mid-apicalinferolateral   myocardium. There is akinesis of the basalanteroseptal   myocardium. The study was not technically sufficient to allow   evaluation of LV diastolic dysfunction due to atrial   fibrillation. - Aortic valve: Trileaflet; normal thickness, mildly calcified   leaflets. There was mild to moderate regurgitation. - Aorta: Aortic root dimension: 38 mm (ED). - Aortic root: The aortic root was mildly dilated. - Mitral valve: There was mild regurgitation. - Left atrium: The atrium was severely dilated. - Right atrium: The atrium was massively dilated. - Pulmonary arteries: PA peak pressure: 36 mm Hg (S). - Pericardium, extracardiac: There was a left pleural effusion.  Impressions:  - The right ventricular systolic pressure was increased consistent   with mild pulmonary hypertension.  Left and right heart cath 09/24/17  There is severe left ventricular systolic dysfunction. The left ventricular ejection fraction is less than 25% by visual estimate.  LV  end diastolic pressure is moderately elevated.  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  There is mild (2+) mitral regurgitation.  --------------------------CORONARY ANATOMY--------------------------------  Colon Flattery 1st Diag lesion is 90% stenosed.  Ost 2nd Diag lesion is 85% stenosed. Ost 2nd Diag to 2nd Diag lesion is 70% stenosed.  Dist Cx lesion is 75% stenosed.  Prox LAD to Mid LAD lesion is 20% stenosed.   Angiographically minimal CAD involving small Diag/Ramus branches and apical Lateral Cx-OM Severely dilated RA and RV with only mildly elevated pulmonary pressures. Severely dilated left ventricle with severe LV dysfunction (EF estimated may be 15 to 20%, but only mildly reduced cardiac output of 4.56 by FICK  EKG:  EKG is personally reviewed today.  The ekg ordered previously demonstrates rate controlled atrial fibrillation with one aberrantly conducted beat  Recent Labs: 09/21/2017: B Natriuretic Peptide 530.7 09/22/2017: ALT 53; TSH 4.472 09/25/2017: BUN 16; Creatinine, Ser 1.13; Hemoglobin 14.4; Platelets 123; Potassium 3.8; Sodium 138  Recent Lipid Panel No results found for: CHOL, TRIG, HDL, CHOLHDL, VLDL, LDLCALC, LDLDIRECT  Physical Exam:    VS:  BP (!) 156/90   Pulse 84   Ht 6' (1.829 m)   Wt 165 lb 3.2 oz (74.9 kg)   SpO2 99%   BMI 22.41 kg/m     Wt Readings from Last 3 Encounters:  01/10/18 165 lb 3.2 oz (74.9 kg)  10/04/17 163 lb (73.9 kg)  09/25/17 156 lb 12.8 oz (71.1 kg)     GEN: Well nourished, well developed in no acute distress HEENT: Normal NECK: No JVD; No carotid bruits LYMPHATICS: No lymphadenopathy CARDIAC: irregularly irregular rhythm, normal S1 and S2, no murmurs, rubs, gallops. Radial and DP pulses 2+ bilaterally. RESPIRATORY:  Clear to auscultation without rales, wheezing or rhonchi  ABDOMEN: Soft, non-tender, non-distended MUSCULOSKELETAL:  No edema; No deformity  SKIN: Warm and dry NEUROLOGIC:  Alert and  oriented x 3 PSYCHIATRIC:  Normal affect   ASSESSMENT:    1. Permanent atrial fibrillation   2. Chronic systolic heart failure ([place])   3. Essential hypertension   4. Encounter for education about heart failure   5. Nonischemic cardiomyopathy ([place])    PLAN:    1. Atrial fibrillation: rate controlled CHA2DS2/VAS Stroke Risk Points =3  >= 2 Points: High Risk  1 - 1.99 Points: Medium Risk  0 Points: Low Risk      Points Metrics  1 Has Congestive Heart Failure:  Yes   0 Has Vascular Disease:  No   0 Has  Hypertension:  No   2 Age:  43   0 Has Diabetes:  No   0 Had Stroke:  No  Had TIA:  No  Had thromboembolism:  No   0 Male:  No     We have discussed his risk for stroke extensively. He wishes to pursue apixaban for anticoagulation and carvedilol for rate control. He does not feel his afib. We have discussed cardioversion, and he declines. Given no plans for rhythm control, would consider him permanent atrial fibrillation.   2. Chronic systolic heart failure:  Euvolemic today. NYHA class 2. -see prior notes: re his preferences for GDMT. Willing to use carvedilol and lisinopril. He self discontinued lisinopril when his EF did not improve, but his BP is actually elevated today c/w hypertension  -restart lisinopril today, to which he is amenable.  -repeat echo showed persistently low EF. We did discuss that he meets criteria for ICD, and we discussed the risk of arrhythmia and sudden cardiac death in cardiomyopathy. He is nonischemic cardiomyopathy by definition. He isn't ready to discuss this with EP yet but will consider.  Do the following things EVERY DAY:  1) Weigh yourself EVERY morning after you go to the bathroom but before you eat or drink anything. Write this number down in a weight log/diary. If you gain 3 pounds overnight or 5 pounds in a week, call the office.  2) Take your medicines as prescribed. If you have concerns about your medications, please call us before you stop  taking them.   3) Eat low salt foods-Limit salt (sodium) to 2000 mg per day. This will help prevent your body from holding onto fluid. Read food labels as many processed foods have a lot of sodium, especially canned goods and prepackaged meats. If you would like some assistance choosing low sodium foods, we would be happy to set you up with a nutritionist.  4) Stay as active as you can everyday. Staying active will give you more energy and make your muscles stronger. Start with 5 minutes at a time and work your way up to 30 minutes a day. Break up your activities--do some in the morning and some in the afternoon. Start with 3 days per week and work your way up to 5 days as you can.  If you have chest pain, feel short of breath, dizzy, or lightheaded, STOP. If you don't feel better after a short rest, call 911. If you do feel better, call the office to let us know you have symptoms with exercise.  5) Limit all fluids for the day to less than 2 liters. Fluid includes all drinks, coffee, juice, ice chips, soup, jello, and all other liquids.   Plan for follow up: 6 mos  Medication Adjustments/Labs and Tests Ordered: Current medicines are reviewed at length with the patient today.  Concerns regarding medicines are outlined above.  No orders of the defined types were placed in this encounter.  Meds ordered this encounter  Medications  . lisinopril (PRINIVIL,ZESTRIL) 2.5 MG tablet    Sig: Take 1 tablet (2.5 mg total) by mouth daily.    Dispense:  30 tablet    Refill:  3    Patient Instructions  Medication Instructions:  Restart: Lisinopril 2.5 mg daily If you need a refill on your cardiac medications before your next appointment, please call your pharmacy.   Lab work: None   Testing/Procedures: None  Follow-Up: At [facility], you and your health needs are our priority.  As  part of our continuing mission to provide you with exceptional heart care, we have created designated Provider  Care Teams.  These Care Teams include your primary Cardiologist (physician) and Advanced Practice Providers (APPs -  Physician Assistants and Nurse Practitioners) who all work together to provide you with the care you need, when you need it. You will need a follow up appointment in 6 months.  Please call our office 2 months in advance to schedule this appointment.  You may see Dr. Harrell Gave or one of the following Advanced Practice Providers on your designated Care Team:   Rosaria Ferries, PA-C . Jory Sims, DNP, ANP  Any Other Special Instructions Will Be Listed Below (If Applicable).       Signed, Austin Dresser, MD PhD 01/10/2018 9:10 AM    [place]

## 2018-01-13 ENCOUNTER — Encounter: Payer: Self-pay | Admitting: Cardiology

## 2018-01-13 DIAGNOSIS — I5022 Chronic systolic (congestive) heart failure: Secondary | ICD-10-CM | POA: Insufficient documentation

## 2018-01-13 DIAGNOSIS — I428 Other cardiomyopathies: Secondary | ICD-10-CM | POA: Insufficient documentation

## 2018-01-21 ENCOUNTER — Other Ambulatory Visit: Payer: Self-pay | Admitting: Cardiology

## 2018-01-21 ENCOUNTER — Telehealth: Payer: Self-pay | Admitting: Cardiology

## 2018-01-21 MED ORDER — APIXABAN 5 MG PO TABS: 5.0000 mg | ORAL_TABLET | Freq: Two times a day (BID) | ORAL | 3 refills | Status: DC

## 2018-01-21 MED ORDER — LISINOPRIL 2.5 MG PO TABS: 2.5000 mg | ORAL_TABLET | Freq: Every day | ORAL | 3 refills | Status: DC

## 2018-01-21 MED ORDER — CARVEDILOL 6.25 MG PO TABS: 6.2500 mg | ORAL_TABLET | Freq: Two times a day (BID) | ORAL | 3 refills | Status: DC

## 2018-01-21 NOTE — Telephone Encounter (Signed)
Spoke to patient and wife  - prescription refilled 90 day supply with 3 refills. E-sent to pharmacy

## 2018-01-21 NOTE — Telephone Encounter (Signed)
New Message   Pt c/o medication issue:  1. Name of Medication: apixaban (ELIQUIS) 5 MG TABS tablet, carvedilol (COREG) 6.25 MG tablet and lisinopril (PRINIVIL,ZESTRIL) 2.5 MG tablet  2. How are you currently taking this medication (dosage and times per day)?   3. Are you having a reaction (difficulty breathing--STAT)? no  4. What is your medication issue? Pt's wife states they have been trying to get these medication refilled but says he was prescribed them in the hospital and don't know what they should do. Please call

## 2018-01-21 NOTE — Telephone Encounter (Signed)
Refill denied. Prescription were refilled in another telephone encounter

## 2018-03-06 ENCOUNTER — Other Ambulatory Visit: Payer: Self-pay | Admitting: *Deleted

## 2018-03-06 NOTE — Patient Outreach (Signed)
[name]) Care Management  03/06/2018  Austin Wong 1940/05/24 [id number]   Subjective: Telephone call to patient's home / mobile number, spoke with patient, and HIPAA verified.  Discussed [facility] Care Management [employer] Utilization Management referral follow up, patient voiced understanding, and is in agreement to follow up.   Patient states he is doing well, just brought his wife home from the hospital earlier today, and needs assistance with Eliquis copay.   States he and his wife are currently receiving 3 month supply of Eliquis at a local pharmacy.   States he is in agreement to [place] referral for medication assistance.  Patient states he does not have any education material, care coordination, disease management, disease monitoring, transportation, community resource, or other needs at this time.  States he is very appreciative of the follow up and is in agreement to receive [place] Management information.     Objective: Per chart review and patient has had no recent ED visits.   Patient hospitalized 09/21/17 -09/25/17 for Atrial fibrillation and New onset acute systolic congestive heart failure.   Patient also has a history of hypertension, Atrial fibrillation with rapid ventricular response, and New onset acute systolic congestive heart failure.        Assessment: Received [employer] Utilization Management referral on 03/04/2018 for Eliquis copay assistance.  Screening follow up completed and no further care management needs.    Patient referred to [place] for medication assistance.     Plan: RNCM will complete case closure due to follow up completed / no care management needs.  RNCM will send referral to [place] for medication assistance.       Berton Butrick H. Annia Friendly, BSN, [place] Management [facility] Telephonic CM Phone: [phone] Fax: [phone]

## 2018-03-24 ENCOUNTER — Ambulatory Visit: Payer: Self-pay | Admitting: Pharmacist

## 2018-03-26 ENCOUNTER — Other Ambulatory Visit: Payer: Self-pay | Admitting: Pharmacist

## 2018-03-27 ENCOUNTER — Ambulatory Visit: Payer: Self-pay | Admitting: Pharmacist

## 2018-03-31 NOTE — Patient Outreach (Signed)
[facility]) Care Management  [place]   03/31/2018  Austin Wong [birth year] [id number]  Reason for referral: Medication Assistance with Eliquis  Referral source: self Current insurance:[employer]  PMHx includes but not limited to:  HTN, Afib, CHF (EF 30-35%)  Outreach:  Successful telephone call with Austin Wong.  HIPAA identifiers verified.  Patient is interested in medication assistance with Eliquis. He states he tolerates Eliquis well.  Denies S/sx of bleeding. Scr stable.  Per cardiologist note, "patient declined TEE/CV, aspirin, statin, or entresto."  He is only on ACEi, DOAC and BB to manage CHF.    Objective: Lab Results  Component Value Date   CREATININE 1.13 09/25/2017   CREATININE 1.15 09/23/2017   CREATININE 1.26 (H) 09/22/2017    No results found for: HGBA1C  Lipid Panel  No results found for: CHOL, TRIG, HDL, CHOLHDL, VLDL, LDLCALC, LDLDIRECT  BP Readings from Last 3 Encounters:  01/10/18 (!) 156/90  10/04/17 118/88  09/25/17 (!) 128/101    Allergies  Allergen Reactions  . Metoprolol Tartrate Nausea Only    Medications Reviewed Today    Reviewed by Lavera Guise, [facility] (Pharmacist) on 03/26/18 at [place] List Status: <None>  Medication Order Taking? Sig Documenting Provider Last Dose Status Informant  apixaban (ELIQUIS) 5 MG TABS tablet [id number] Yes Take 1 tablet (5 mg total) by mouth 2 (two) times daily. [name], MD Taking Active   Ascorbic Acid (VITAMIN C) 100 MG tablet [id number] Yes Take 1,000 mg by mouth daily. [provider] Taking Active Self  B Complex Vitamins (VITAMIN B COMPLEX PO) [id number] Yes Take 1 tablet by mouth daily. [provider] Taking Active Self  carvedilol (COREG) 6.25 MG tablet [id number] Yes Take 1 tablet (6.25 mg total) by mouth 2 (two) times daily with a meal. [name], MD Taking Active   Cholecalciferol (VITAMIN D-1000 MAX ST) 1000 units tablet  [id number] Yes Take 1,000 Units by mouth daily. [provider] Taking Active Self  Co-Enzyme Q-10 30 MG CAPS [id number] Yes Take 30 mg by mouth daily. [provider] Taking Active Self  Lactobacillus (PROBIOTIC ACIDOPHILUS PO) [id number] Yes Take 1 tablet by mouth daily. [provider] Taking Active Self  lisinopril (PRINIVIL,ZESTRIL) 2.5 MG tablet [id number] Yes Take 1 tablet (2.5 mg total) by mouth daily. [name], MD Taking Active   Magnesium Gluconate 550 MG TABS [id number] Yes Take 550 mg by mouth daily. [provider] Taking Active Self  Milk Thistle Extract 175 MG TABS [id number] Yes Take 175 mg by mouth daily. [provider] Taking Active Self  Omega-3 Fatty Acids (RA FISH OIL) 1000 MG CAPS [id number] Yes Take 1,000 mg by mouth daily. [provider] Taking Active Self  zinc gluconate 50 MG tablet [id number] Yes Take 50 mg by mouth 3 (three) times a week. [provider] Taking Active Self          Assessment:  Drugs sorted by system: Cardiovascular: apixaban, carvedilol, lisinopril  Gastrointestinal: lactobacillus  Vitamins/Minerals/Supplements: vitD, vitB, vitC, mag, fishoil, zinc, Coq10   Medication Review Findings:  . Patient denies other medications that have proven to have mortality data in patients with heart failure   Medication Assistance Findings:   Extra Help:   _0  Already receiving Full Extra Help  _1  Already receiving Partial Extra Help  _2  Eligible based on reported income and assets  _3  Not Eligible based on reported income and assets  Patient Assistance Programs: 1) Eliquis made  by BMS o Income requirement met: _0  Yes _1  No _2  Unknown o Out-of-pocket prescription expenditure met:    _3  Yes _4  No  _5  Unknown  <[id number]>_4<\/[id number]>_0  Not applicable - Patient has not met application requirements to apply for this patient assistance program at this time.    Plan: -Will reach out to patient in March to assess PAP  eligibility    Regina Eck, PharmD, [place]  [phone]

## 2018-04-10 ENCOUNTER — Emergency Department (HOSPITAL_COMMUNITY)
Admission: EM | Admit: 2018-04-10 | Discharge: 2018-04-10 | Disposition: A | Discharge status: Home / Self Care | Payer: PPO | Attending: Emergency Medicine | Admitting: Emergency Medicine

## 2018-04-10 ENCOUNTER — Encounter (HOSPITAL_COMMUNITY): Payer: Self-pay | Admitting: Emergency Medicine

## 2018-04-10 ENCOUNTER — Other Ambulatory Visit: Payer: Self-pay

## 2018-04-10 DIAGNOSIS — N189 Chronic kidney disease, unspecified: Secondary | ICD-10-CM | POA: Diagnosis not present

## 2018-04-10 DIAGNOSIS — Y92009 Unspecified place in unspecified non-institutional (private) residence as the place of occurrence of the external cause: Secondary | ICD-10-CM | POA: Diagnosis not present

## 2018-04-10 DIAGNOSIS — Y999 Unspecified external cause status: Secondary | ICD-10-CM | POA: Diagnosis not present

## 2018-04-10 DIAGNOSIS — R000 Tachycardia, unspecified: Secondary | ICD-10-CM | POA: Diagnosis not present

## 2018-04-10 DIAGNOSIS — I5022 Chronic systolic (congestive) heart failure: Secondary | ICD-10-CM | POA: Diagnosis not present

## 2018-04-10 DIAGNOSIS — X58XXXA Exposure to other specified factors, initial encounter: Secondary | ICD-10-CM | POA: Diagnosis not present

## 2018-04-10 DIAGNOSIS — I130 Hypertensive heart and chronic kidney disease with heart failure and stage 1 through stage 4 chronic kidney disease, or unspecified chronic kidney disease: Secondary | ICD-10-CM | POA: Diagnosis not present

## 2018-04-10 DIAGNOSIS — Y939 Activity, unspecified: Secondary | ICD-10-CM | POA: Insufficient documentation

## 2018-04-10 DIAGNOSIS — I10 Essential (primary) hypertension: Secondary | ICD-10-CM | POA: Diagnosis not present

## 2018-04-10 DIAGNOSIS — Z79899 Other long term (current) drug therapy: Secondary | ICD-10-CM | POA: Insufficient documentation

## 2018-04-10 DIAGNOSIS — T17920A Food in respiratory tract, part unspecified causing asphyxiation, initial encounter: Secondary | ICD-10-CM | POA: Diagnosis not present

## 2018-04-10 DIAGNOSIS — T18108A Unspecified foreign body in esophagus causing other injury, initial encounter: Secondary | ICD-10-CM

## 2018-04-10 DIAGNOSIS — R52 Pain, unspecified: Secondary | ICD-10-CM | POA: Diagnosis not present

## 2018-04-10 DIAGNOSIS — T18128A Food in esophagus causing other injury, initial encounter: Secondary | ICD-10-CM | POA: Diagnosis not present

## 2018-04-10 DIAGNOSIS — I4891 Unspecified atrial fibrillation: Secondary | ICD-10-CM | POA: Diagnosis not present

## 2018-04-10 HISTORY — DX: Unspecified atrial fibrillation: I48.91

## 2018-04-10 MED ORDER — OMEPRAZOLE 20 MG PO CPDR: 20.0000 mg | DELAYED_RELEASE_CAPSULE | Freq: Every day | ORAL | 0 refills | Status: DC

## 2018-04-10 NOTE — ED Triage Notes (Signed)
Patient arrived by EMS from home. Pt felt like he had something stuck in throat causing pain from his chest down to his stomach. Pt has increased salvation, pain in chest.  Hx of A-fib  BP 188/90, Hr 80, SPO2 96% on RA, RR 18, CBG 193.   IV 18G LFT Hand

## 2018-04-10 NOTE — ED Provider Notes (Signed)
[facility] DEPT Provider Note   CSN: [id number] Arrival date & time: 04/10/18  1528     History   Chief Complaint Chief Complaint  Patient presents with  . Abdominal Pain    HPI Austin Wong is a 78 y.o. male.  HPI Patient took a large vitamin C pill at approximately 11:45 in the morning.  He reports that he felt like it got stuck in his throat.  He tried at home for about 3 hours to get it to pass.  He kept coughing out mucousy phlegm.  Nothing he did would get it to move.  It started to become really painful up towards the top of his chest.  Eventually, at about 3:00 he decided he needed to call EMS.  He reports that they were on route and he vomited once and then the pain seemed to be slightly worse.  He reports he at that time had acquiesced to getting some pain medicine.  He thought he had gotten the pain medicine because the pain just suddenly moved down and went away.  EMS advised him that he had not yet gotten any pain medication.  He reports since that time he has been much better.  He reports he is swallowing without difficulty.  No further coughing or vomiting.  He denies any prior history of having problems with food sticking or getting stuck when he swallows.  No known history of reflux although he believes he has a hiatal hernia.  He does not take any PPI or antacid agents.  Patient has treated atrial fibrillation taking Eliquis and carvedilol.  No acute complaints of shortness of breath or chest pain prior to getting the pill stuck. Past Medical History:  Diagnosis Date  . Atrial fibrillation ([place])   . Chronic kidney disease    Creatinine 1.2 in 2012  . Hypertension    Diet controlled in the past    Patient Active Problem List   Diagnosis Date Noted  . Chronic systolic heart failure ([place]) 01/13/2018  . Nonischemic cardiomyopathy ([place]) 01/13/2018  . Permanent atrial fibrillation   . Essential hypertension   . Chronic kidney disease      Past Surgical History:  Procedure Laterality Date  . INGUINAL HERNIA REPAIR     bilateral  . RIGHT/LEFT HEART CATH AND CORONARY ANGIOGRAPHY N/A 09/24/2017   Procedure: RIGHT/LEFT HEART CATH AND CORONARY ANGIOGRAPHY;  Surgeon: Leonie Man, MD;  Location: [facility];  Service: Cardiovascular;  Laterality: N/A;  . TONSILLECTOMY          Home Medications    Prior to Admission medications   Medication Sig Start Date End Date Taking? Authorizing Provider  apixaban (ELIQUIS) 5 MG TABS tablet Take 1 tablet (5 mg total) by mouth 2 (two) times daily. 01/21/18   [name], MD  Ascorbic Acid (VITAMIN C) 100 MG tablet Take 1,000 mg by mouth daily.    [provider]  B Complex Vitamins (VITAMIN B COMPLEX PO) Take 1 tablet by mouth daily.    [provider]  carvedilol (COREG) 6.25 MG tablet Take 1 tablet (6.25 mg total) by mouth 2 (two) times daily with a meal. 01/21/18   [name], MD  Cholecalciferol (VITAMIN D-1000 MAX ST) 1000 units tablet Take 1,000 Units by mouth daily.    [provider]  Co-Enzyme Q-10 30 MG CAPS Take 30 mg by mouth daily.    [provider]  Lactobacillus (PROBIOTIC ACIDOPHILUS PO) Take 1 tablet  by mouth daily.    [provider]  lisinopril (PRINIVIL,ZESTRIL) 2.5 MG tablet Take 1 tablet (2.5 mg total) by mouth daily. 01/21/18   [name], MD  Magnesium Gluconate 550 MG TABS Take 550 mg by mouth daily.    [provider]  Milk Thistle Extract 175 MG TABS Take 175 mg by mouth daily.    [provider]  Omega-3 Fatty Acids (RA FISH OIL) 1000 MG CAPS Take 1,000 mg by mouth daily.    [provider]  omeprazole (PRILOSEC) 20 MG capsule Take 1 capsule (20 mg total) by mouth daily. 04/10/18   Charlesetta Shanks, MD  zinc gluconate 50 MG tablet Take 50 mg by mouth 3 (three) times a week.    [provider]    Family History Family History   Problem Relation Age of Onset  . CAD Father   . Hyperlipidemia Father     Social History Social History   Tobacco Use  . Smoking status: Never Smoker  . Smokeless tobacco: Never Used  Substance Use Topics  . Alcohol use: Yes    Alcohol/week: 1.0 standard drinks    Types: 1 Cans of beer per week  . Drug use: Not on file     Allergies   Metoprolol tartrate   Review of Systems Review of Systems 10 Systems reviewed and are negative for acute change except as noted in the HPI.   Physical Exam Updated Vital Signs BP (!) 168/136 (BP Location: Left Arm)   Pulse 88   Temp 97.7 F (36.5 C) (Oral)   Resp 14   Ht 6\' 1"  (1.854 m)   Wt 72.1 kg   SpO2 98%   BMI 20.98 kg/m   Physical Exam Constitutional:      Appearance: Normal appearance.  HENT:     Head: Normocephalic and atraumatic.     Mouth/Throat:     Pharynx: Oropharynx is clear.  Eyes:     Extraocular Movements: Extraocular movements intact.  Cardiovascular:     Comments: Irregularly irregular.  No gross rub murmur gallop.  Distal pulses normal. Pulmonary:     Effort: Pulmonary effort is normal.     Breath sounds: Normal breath sounds.  Abdominal:     General: There is no distension.     Palpations: Abdomen is soft.     Tenderness: There is no abdominal tenderness. There is no guarding.  Musculoskeletal: Normal range of motion.        General: No tenderness.  Skin:    General: Skin is warm and dry.  Neurological:     General: No focal deficit present.     Mental Status: He is alert and oriented to person, place, and time.     Coordination: Coordination normal.  Psychiatric:        Mood and Affect: Mood normal.      ED Treatments / Results  Labs (all labs ordered are listed, but only abnormal results are displayed) Labs Reviewed - No data to display  EKG None  Radiology No results found.  Procedures Procedures (including critical care time)  Medications Ordered in ED Medications - No  data to display   Initial Impression / Assessment and Plan / ED Course  I have reviewed the triage vital signs and the nursing notes.  Pertinent labs & imaging results that were available during my care of the patient were reviewed by me and considered in my medical decision making (see chart for details).  Patient had a large vitamin C pill that had gotten stuck in the esophagus.  Ultimately after about 3 hours he was able to vomit and then felt like it went down.  He has had a full cup of water without any difficulty swallowing and no regurgitation.  Symptoms are resolved.  At this time recommendation is to have soft diet, take a daily PPI, follow-up with GI.  Patient is counseled on the nature of esophageal strictures and likely need for follow-up EGD.  Return precautions reviewed.  Final Clinical Impressions(s) / ED Diagnoses   Final diagnoses:  Impacted esophageal foreign body, initial encounter    ED Discharge Orders         Ordered    omeprazole (PRILOSEC) 20 MG capsule  Daily     04/10/18 1644           Charlesetta Shanks, MD 04/10/18 1651

## 2018-04-10 NOTE — Discharge Instructions (Addendum)
1.  Eat only foods that are soft and nearly pured.  Especially avoid any larger or tougher pieces of food such as meats. 2.  Take a daily Prilosec as prescribed until you get your follow-up appointment for recheck. 3.  Follow-up with your family doctor at the beginning of the week and get a referral for gastroenterology.  People who have food or medications get stuck in the esophagus often need to have an upper endoscopy done to make sure they are not developing any areas of narrowing or cancer.

## 2018-04-10 NOTE — ED Notes (Signed)
Bed: YB71 Expected date:  Expected time:  Means of arrival:  Comments: EMS something stuck in throat

## 2018-04-10 NOTE — ED Notes (Signed)
Patient given discharge teaching and verbalized understanding. Patient ambulated out of ED with a steady gait. 

## 2018-05-05 ENCOUNTER — Ambulatory Visit: Payer: Self-pay | Admitting: Pharmacist

## 2018-05-19 ENCOUNTER — Ambulatory Visit: Payer: Self-pay | Admitting: Pharmacist

## 2018-07-10 ENCOUNTER — Other Ambulatory Visit: Payer: Self-pay | Admitting: Pharmacist

## 2018-07-10 NOTE — Patient Outreach (Signed)
[facility]) Care Management  [employer] [facility]   07/10/2018  Austin Wong 1941-01-27 [id number]  Reason for referral: Medication Assistance  Referral source: self Current insurance: [employer]  PMHx includes but not limited to:  Afib -->CHA2DS2-VASc Score = 4 (age, HTN, CHF) , HTN, CKD, new onset CHF  Outreach:  Successful telephone call with Austin Wong.  HIPAA identifiers verified.  Patient agreeable to review medications telephonically.  He states his blood pressure and heart rate are well controlled.  He is currently rate controlled on carvedilol and is on apixaban (Eliquis) based on CHADS2Vasc = 4 (age, HTN, new CHF).   He reports he is stable on current Eliquis dose of '5mg'$  twice daily.  Patient denies S/SX of bleeding.  He states his Eliquis is expensive, but denies issues obtaining other medications.  He is agreeable to participate in PAP to obtain Eliquis free of charge via manufacturer ([place]).  His cardiologist is Dr. [name].  Objective: Lab Results  Component Value Date   CREATININE 1.13 09/25/2017   CREATININE 1.15 09/23/2017   CREATININE 1.26 (H) 09/22/2017   BP Readings from Last 3 Encounters:  04/10/18 (!) 162/105  01/10/18 (!) 156/90  10/04/17 118/88    Medications Reviewed Today    Reviewed by Lavera Guise, [place] (Pharmacist) on 03/26/18 at [place] List Status: <None>  Medication Order Taking? Sig Documenting Provider Last Dose Status Informant  apixaban (ELIQUIS) 5 MG TABS tablet [id number] Yes Take 1 tablet (5 mg total) by mouth 2 (two) times daily. [name], MD Taking Active   Ascorbic Acid (VITAMIN C) 100 MG tablet [id number] Yes Take 1,000 mg by mouth daily. [provider] Taking Active Self  B Complex Vitamins (VITAMIN B COMPLEX PO) [id number] Yes Take 1 tablet by mouth daily. [provider] Taking Active Self  carvedilol (COREG) 6.25 MG tablet [id number] Yes Take 1  tablet (6.25 mg total) by mouth 2 (two) times daily with a meal. [name], MD Taking Active   Cholecalciferol (VITAMIN D-1000 MAX ST) 1000 units tablet [id number] Yes Take 1,000 Units by mouth daily. [provider] Taking Active Self  Co-Enzyme Q-10 30 MG CAPS [id number] Yes Take 30 mg by mouth daily. [provider] Taking Active Self  Lactobacillus (PROBIOTIC ACIDOPHILUS PO) [id number] Yes Take 1 tablet by mouth daily. [provider] Taking Active Self  lisinopril (PRINIVIL,ZESTRIL) 2.5 MG tablet [id number] Yes Take 1 tablet (2.5 mg total) by mouth daily. [name], MD Taking Active   Magnesium Gluconate 550 MG TABS [id number] Yes Take 550 mg by mouth daily. [provider] Taking Active Self  Milk Thistle Extract 175 MG TABS [id number] Yes Take 175 mg by mouth daily. [provider] Taking Active Self  Omega-3 Fatty Acids (RA FISH OIL) 1000 MG CAPS [id number] Yes Take 1,000 mg by mouth daily. [provider] Taking Active Self  zinc gluconate 50 MG tablet [id number] Yes Take 50 mg by mouth 3 (three) times a week. [provider] Taking Active Self          Assessment:  Drugs sorted by system:  Cardiovascular: apixaban, fish oil, carvedilol, lisinopril  Vitamins/Minerals/Supplements: CoQ10, magnesium, milk thistle, zinc, vitC  Medication Assistance Findings:   Patient Assistance Programs: Eliquis made by [place] requirement met: Yes o Out-of-pocket prescription expenditure met:   Unknown - Patient has met application requirements to apply for this patient assistance program.     Plan: .  I will route patient assistance letter to [place] technician who will coordinate patient assistance program application process for medications listed above.  [facility] pharmacy technician will assist with obtaining all required documents from both patient and provider(s) and submit application(s) once completed.    . I will follow up as needed for patient assistance process   Regina Eck, PharmD, [place] [phone]

## 2018-07-11 ENCOUNTER — Other Ambulatory Visit: Payer: Self-pay | Admitting: Pharmacy Technician

## 2018-07-11 NOTE — Patient Outreach (Signed)
[facility]) Care Management  07/11/2018  Austin Wong [birth year] [id number]                          Medication Assistance Referral  Referral From: [name]: Eliquis / [name] Patient application portion:  Mailed Provider application portion: Faxed  to Dr. Harrell Gave   Follow up:  Will follow up with patient in 7-10 business days to confirm application(s) have been received.  Maud Deed Chana Bode [place] Certified Pharmacy Technician [place] Management Direct Dial:[phone]

## 2018-07-14 ENCOUNTER — Ambulatory Visit: Payer: Self-pay | Admitting: Pharmacist

## 2018-07-18 ENCOUNTER — Ambulatory Visit: Payer: Self-pay | Admitting: Pharmacist

## 2018-07-24 ENCOUNTER — Other Ambulatory Visit: Payer: Self-pay | Admitting: Pharmacy Technician

## 2018-07-24 NOTE — Patient Outreach (Signed)
[facility]) Care Management  07/24/2018  Austin Wong November 28, 1940 [id number]   Received patient portion(s) of patient assistance application for Eliquis. Faxed completed application and required documents into [employer].  Faxed spouse's OOP spend in as well.  Will follow up with company in 7-10 business days to check status of application.  Maud Deed Chana Bode [place] Certified Pharmacy Technician [place] Management Direct Dial:[phone]

## 2018-07-28 ENCOUNTER — Other Ambulatory Visit: Payer: Self-pay | Admitting: Pharmacy Technician

## 2018-07-28 NOTE — Patient Outreach (Signed)
[facility]) Care Management  07/28/2018  Austin Wong Dec 13, 1940 [id number]    Incoming call from patient regarding patient assistance application(s) for Eliquis , [name] states that [employer] contacted him on Friday 5/29 and informed him that he has been approved for the program until 02/26/19. Medication to arrive at patients home 07/29/18. [name] inquired about his wife's application for Eliquis. Informed him that once provider portion of her application has been received will submit to company.  Maud Deed Chana Bode [place] Certified Pharmacy Technician [place] Management Direct Dial:[phone]

## 2018-07-30 ENCOUNTER — Ambulatory Visit: Payer: PPO | Admitting: Cardiology

## 2018-08-01 ENCOUNTER — Encounter: Payer: Self-pay | Admitting: Cardiology

## 2018-08-01 ENCOUNTER — Telehealth (INDEPENDENT_AMBULATORY_CARE_PROVIDER_SITE_OTHER): Payer: PPO | Admitting: Cardiology

## 2018-08-01 VITALS — BP 119/76 | HR 65 | Ht 72.0 in | Wt 159.0 lb

## 2018-08-01 DIAGNOSIS — I4821 Permanent atrial fibrillation: Secondary | ICD-10-CM

## 2018-08-01 DIAGNOSIS — R6889 Other general symptoms and signs: Secondary | ICD-10-CM

## 2018-08-01 DIAGNOSIS — I428 Other cardiomyopathies: Secondary | ICD-10-CM | POA: Diagnosis not present

## 2018-08-01 DIAGNOSIS — Z7189 Other specified counseling: Secondary | ICD-10-CM

## 2018-08-01 DIAGNOSIS — I5022 Chronic systolic (congestive) heart failure: Secondary | ICD-10-CM | POA: Diagnosis not present

## 2018-08-01 NOTE — Patient Instructions (Signed)

## 2018-08-01 NOTE — Progress Notes (Signed)
Virtual Visit via Telephone Note   This visit type was conducted due to national recommendations for restrictions regarding the COVID-19 Pandemic (e.g. social distancing) in an effort to limit this patient's exposure and mitigate transmission in our community.  Due to his co-morbid illnesses, this patient is at least at moderate risk for complications without adequate follow up.  This format is felt to be most appropriate for this patient at this time.  The patient did not have access to video technology/had technical difficulties with video requiring transitioning to audio format only (telephone).  All issues noted in this document were discussed and addressed.  No physical exam could be performed with this format.  Please refer to the patient's chart for his  consent to telehealth for [facility].   Date:  08/01/2018   ID:  Austin Wong, DOB 1940-03-13, MRN [id number]  Patient Location: Home Provider Location: Home  PCP:  Christain [place], MD  Cardiologist:  [name], MD  Electrophysiologist:  None   Evaluation Performed:  Follow-Up Visit  Chief Complaint:  Follow up  History of Present Illness:    Austin Wong is a 78 y.o. male with a hx of atrial fibrillation (presented with RVR) and systolic and diastolic heart failure, new presentation of both in 08/2017.  The patient does not have symptoms concerning for COVID-19 infection (fever, chills, cough, or new shortness of breath).   Today: Overall doing well. No chest pain, shortness of breath, palpitations. Tolerating medications well. Working with getting assistance with DOAC cost, appreciative of this service. His only concern today is that he feels like he gets tired sooner than he did before. He is able to do household activities, stairs, walking, etc--he just feels like he has less energy than before.  Denies PND, orthopnea, LE edema or unexpected weight gain. No syncope or palpitations.  Past Medical History:   Diagnosis Date  . Atrial fibrillation ([place])   . Chronic kidney disease    Creatinine 1.2 in 2012  . Hypertension    Diet controlled in the past   Past Surgical History:  Procedure Laterality Date  . INGUINAL HERNIA REPAIR     bilateral  . RIGHT/LEFT HEART CATH AND CORONARY ANGIOGRAPHY N/A 09/24/2017   Procedure: RIGHT/LEFT HEART CATH AND CORONARY ANGIOGRAPHY;  Surgeon: Leonie Man, MD;  Location: [facility];  Service: Cardiovascular;  Laterality: N/A;  . TONSILLECTOMY       Current Meds  Medication Sig  . apixaban (ELIQUIS) 5 MG TABS tablet Take 1 tablet (5 mg total) by mouth 2 (two) times daily.  . Ascorbic Acid (VITAMIN C) 100 MG tablet Take 1,000 mg by mouth daily.  . B Complex Vitamins (VITAMIN B COMPLEX PO) Take 1 tablet by mouth daily.  . carvedilol (COREG) 6.25 MG tablet Take 1 tablet (6.25 mg total) by mouth 2 (two) times daily with a meal.  . Cholecalciferol (VITAMIN D-1000 MAX ST) 1000 units tablet Take 1,000 Units by mouth daily.  Marland Kitchen Co-Enzyme Q-10 30 MG CAPS Take 30 mg by mouth daily.  . Lactobacillus (PROBIOTIC ACIDOPHILUS PO) Take 1 tablet by mouth daily.  Marland Kitchen lisinopril (PRINIVIL,ZESTRIL) 2.5 MG tablet Take 1 tablet (2.5 mg total) by mouth daily.  . Magnesium Gluconate 550 MG TABS Take 550 mg by mouth daily.  . Milk Thistle Extract 175 MG TABS Take 175 mg by mouth daily.  . Omega-3 Fatty Acids (RA FISH OIL) 1000 MG CAPS Take 1,000 mg by mouth daily.  Marland Kitchen omeprazole (  PRILOSEC) 20 MG capsule Take 1 capsule (20 mg total) by mouth daily.  Marland Kitchen zinc gluconate 50 MG tablet Take 50 mg by mouth 3 (three) times a week.     Allergies:   Metoprolol tartrate   Social History   Tobacco Use  . Smoking status: Never Smoker  . Smokeless tobacco: Never Used  Substance Use Topics  . Alcohol use: Yes    Alcohol/week: 1.0 standard drinks    Types: 1 Cans of beer per week  . Drug use: Not on file     Family Hx: The patient's family history includes CAD in his father;  Hyperlipidemia in his father.  ROS:   Please see the history of present illness.    Constitutional: Negative for chills, fever, night sweats, unintentional weight loss  HENT: Negative for ear pain and hearing loss.   Eyes: Negative for loss of vision and eye pain.  Respiratory: Negative for cough, sputum, wheezing.   Cardiovascular: See HPI. Gastrointestinal: Negative for abdominal pain, melena, and hematochezia.  Genitourinary: Negative for dysuria and hematuria.  Musculoskeletal: Negative for falls and myalgias.  Skin: Negative for itching and rash.  Neurological: Negative for focal weakness, focal sensory changes and loss of consciousness.  Endo/Heme/Allergies: Does bruise/bleed easily.  All other systems reviewed and are negative.   Prior CV studies:   The following studies were reviewed today: Echo, [facility] 2019 reviewed  Labs/Other Tests and Data Reviewed:    EKG:  An ECG dated 10/04/17 was personally reviewed today and demonstrated:  afib, PVC  Recent Labs: 09/21/2017: B Natriuretic Peptide 530.7 09/22/2017: ALT 53; TSH 4.472 09/25/2017: BUN 16; Creatinine, Ser 1.13; Hemoglobin 14.4; Platelets 123; Potassium 3.8; Sodium 138   Recent Lipid Panel No results found for: CHOL, TRIG, HDL, CHOLHDL, LDLCALC, LDLDIRECT  Wt Readings from Last 3 Encounters:  08/01/18 159 lb (72.1 kg)  04/10/18 159 lb (72.1 kg)  01/10/18 165 lb 3.2 oz (74.9 kg)     Objective:    Vital Signs:  BP 119/76   Pulse 65   Ht 6' (1.829 m)   Wt 159 lb (72.1 kg)   BMI 21.56 kg/m    Speaking comfortably on the phone, in no acute distress  ASSESSMENT & PLAN:    Atrial fibrillation: permanent at this time. Rate controlled. Chadsvasc=3 -continue apixaban -continue carvedilol  Chronic systolic heart failure: no symptoms on report. Nonischemic cardiomyopathy. Not improved after 3 mos of meds and rate control. -have had long discussions re: GDMT. Declines entresto -tolerating lisinopril and carvedilol.   -have discussed ICD in the past. Not currently interested, continue to follow -reviewed heart failure education, daily weights, sodium and fluid restriction, activity  Decreased exercise tolerance: no clear change in his rate control, no heart failure symptoms. He denies other symptoms of atrial fibrillation such as palpitations, but may be a combination of afib/HF -he reports this is manageable right now, continue to follow  COVID-19 Education: The signs and symptoms of COVID-19 were discussed with the patient and how to seek care for testing (follow up with PCP or arrange E-visit).  The importance of social distancing was discussed today.  Time:   Today, I have spent 22 minutes with the patient with telehealth technology discussing the above problems.    Patient Instructions  Medication Instructions:  Your Physician recommend you continue on your current medication as directed.    If you need a refill on your cardiac medications before your next appointment, please call your pharmacy.   Lab  work: None  Testing/Procedures: None  Follow-Up: At [facility], you and your health needs are our priority.  As part of our continuing mission to provide you with exceptional heart care, we have created designated Provider Care Teams.  These Care Teams include your primary Cardiologist (physician) and Advanced Practice Providers (APPs -  Physician Assistants and Nurse Practitioners) who all work together to provide you with the care you need, when you need it. You will need a follow up appointment in 6 months.  Please call our office 2 months in advance to schedule this appointment.  You may see [name], MD or one of the following Advanced Practice Providers on your designated Care Team:   Rosaria Ferries, PA-C . Jory Sims, DNP, ANP     Medication Adjustments/Labs and Tests Ordered: Current medicines are reviewed at length with the patient today.  Concerns regarding  medicines are outlined above.   Tests Ordered: No orders of the defined types were placed in this encounter.   Medication Changes: No orders of the defined types were placed in this encounter.   Disposition:  Follow up 6 mos or sooner PRN  Signed, [name], MD  08/01/2018  [facility]

## 2018-08-06 ENCOUNTER — Other Ambulatory Visit: Payer: Self-pay | Admitting: Pharmacy Technician

## 2018-08-06 NOTE — Patient Outreach (Signed)
[facility]) Care Management  08/06/2018  Austin Wong September 18, 1940 [id number]    Successful call placed to [name]  regarding patient assistance medication receipt from [place], HIPAA identifiers verified. [name] confirms that she and [name] both received their Eliquis from [employer]. Reviewed with her how to obtain refills and requested that they contact me if they run into any issues. She stated she appreciated our help and that she would call if she needs too.  Follow up:  Will route note to [place] for case closure.  Maud Deed Chana Bode [place] Certified Pharmacy Technician [place] Management Direct Dial:[phone]

## 2018-08-07 ENCOUNTER — Encounter: Payer: Self-pay | Admitting: Cardiology

## 2018-08-11 ENCOUNTER — Other Ambulatory Visit: Payer: Self-pay | Admitting: Pharmacist

## 2018-08-20 DIAGNOSIS — R6889 Other general symptoms and signs: Secondary | ICD-10-CM | POA: Insufficient documentation

## 2018-10-20 ENCOUNTER — Other Ambulatory Visit: Payer: Self-pay | Admitting: Cardiology

## 2019-01-21 ENCOUNTER — Other Ambulatory Visit: Payer: Self-pay | Admitting: Cardiology

## 2019-02-17 ENCOUNTER — Other Ambulatory Visit: Payer: Self-pay

## 2019-02-17 ENCOUNTER — Ambulatory Visit (INDEPENDENT_AMBULATORY_CARE_PROVIDER_SITE_OTHER): Payer: PPO | Admitting: Cardiology

## 2019-02-17 ENCOUNTER — Encounter: Payer: Self-pay | Admitting: Cardiology

## 2019-02-17 VITALS — BP 158/82 | HR 96 | Temp 96.4°F | Ht 72.0 in | Wt 161.4 lb

## 2019-02-17 DIAGNOSIS — Z7189 Other specified counseling: Secondary | ICD-10-CM

## 2019-02-17 DIAGNOSIS — I5022 Chronic systolic (congestive) heart failure: Secondary | ICD-10-CM | POA: Diagnosis not present

## 2019-02-17 DIAGNOSIS — I428 Other cardiomyopathies: Secondary | ICD-10-CM | POA: Diagnosis not present

## 2019-02-17 DIAGNOSIS — Z713 Dietary counseling and surveillance: Secondary | ICD-10-CM | POA: Diagnosis not present

## 2019-02-17 DIAGNOSIS — N189 Chronic kidney disease, unspecified: Secondary | ICD-10-CM

## 2019-02-17 DIAGNOSIS — I4821 Permanent atrial fibrillation: Secondary | ICD-10-CM

## 2019-02-17 DIAGNOSIS — I130 Hypertensive heart and chronic kidney disease with heart failure and stage 1 through stage 4 chronic kidney disease, or unspecified chronic kidney disease: Secondary | ICD-10-CM

## 2019-02-17 MED ORDER — APIXABAN 5 MG PO TABS: 5.0000 mg | ORAL_TABLET | Freq: Two times a day (BID) | ORAL | 3 refills | Status: DC

## 2019-02-17 NOTE — Patient Instructions (Signed)

## 2019-02-17 NOTE — Progress Notes (Signed)
Cardiology Office Note:    Date:  02/17/2019   ID:  Sheila Pavlicek, DOB July 29, 1940, MRN [id number]  PCP:  Christain Sacramento, MD  Cardiologist:  [name], MD PhD  Referring MD: Christain Sacramento, MD   CC: follow up  History of Present Illness:    Shanard Dutta is a 78 y.o. male with a hx of atrial fibrillation (presented with RVR) and acute systolic and diastolic heart failure 123XX123. He presents today for follow up.  Please see discharge summary from 09/25/17 for full details, and study information included below. Briefly, Mr. Galindo had a several month history of dyspnea on exertion. He presented with afib RVR and was found to have flatly elevated troponins, elevated BNP, and bilateral pleural effusion. During his hospitalization he had an echo which showed reduced ejection fraction. 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 He was diuresed and started on medications. Of note, please see my notes during his hospitalization--we had long, extensive conversations regarding guideline medical therapy. Mr. Schlossberg has strong personal beliefs regarding medications and supplements to manage his health. As per my notes, his decision regarding medications was to take anticoagulation for atrial fibrillation, rate control and systolic heart failure management with carvedilol, and heart failure management with lisinopril. He declined TEE/CV, aspirin, statin, or entresto.  Today: First visit since loss of his wife, Denny Peon. Offered my condolences again. He is coping as well as can be expected. She used to do all the cooking, so he has noted that he has been eating less and losing some weight. Does eat a lot of vegetables, has never eaten much meat. Does eat some sugar. Minimal cheese. Does eat olive oil, nuts, etc. We discussed how to get in good fats/protein now that he is cooking for himself.  BP  at home 99-117/70-83. Well controlled without symptomatic hypotension. Heart rate today in the 70s. Energy levels remain below his ideal but are stable.   Denies chest pain, shortness of breath at rest or with normal exertion. No PND, orthopnea, LE edema or unexpected weight gain. No syncope or palpitations.No dizzyness/lightheadedness. No issues with medications.  We again discussed ICD in depth. He is not interested.   Past Medical History:  Diagnosis Date  . Atrial fibrillation ([place])   . Chronic kidney disease    Creatinine 1.2 in 2012  . Hypertension    Diet controlled in the past    Past Surgical History:  Procedure Laterality Date  . INGUINAL HERNIA REPAIR     bilateral  . RIGHT/LEFT HEART CATH AND CORONARY ANGIOGRAPHY N/A 09/24/2017   Procedure: RIGHT/LEFT HEART CATH AND CORONARY ANGIOGRAPHY;  Surgeon: Leonie Man, MD;  Location: [facility];  Service: Cardiovascular;  Laterality: N/A;  . TONSILLECTOMY      Current Medications: Current Outpatient Medications on File Prior to Visit  Medication Sig  . apixaban (ELIQUIS) 5 MG TABS tablet Take 1 tablet (5 mg total) by mouth 2 (two) times daily.  . Ascorbic Acid (VITAMIN C) 100 MG tablet Take 1,000 mg by mouth daily.  . B Complex Vitamins (VITAMIN B COMPLEX PO) Take 1 tablet by mouth daily.  . carvedilol (COREG) 6.25 MG tablet TAKE ONE TABLET TWICE DAILY WITH A MEAL  . Cholecalciferol (VITAMIN D-1000 MAX ST) 1000 units tablet Take 1,000 Units by mouth daily.  Marland Kitchen Co-Enzyme Q-10 30 MG CAPS Take 30 mg by mouth daily.  Marland Kitchen  Lactobacillus (PROBIOTIC ACIDOPHILUS PO) Take 1 tablet by mouth daily.  Marland Kitchen lisinopril (ZESTRIL) 2.5 MG tablet TAKE ONE TABLET EVERY DAY  . Magnesium Gluconate 550 MG TABS Take 550 mg by mouth daily.  . Milk Thistle Extract 175 MG TABS Take 175 mg by mouth daily.  . Omega-3 Fatty Acids (RA FISH OIL) 1000 MG CAPS Take 1,000 mg by mouth daily.  Marland Kitchen omeprazole (PRILOSEC) 20 MG capsule Take 1 capsule (20 mg  total) by mouth daily.  Marland Kitchen zinc gluconate 50 MG tablet Take 50 mg by mouth 3 (three) times a week.   No current facility-administered medications on file prior to visit.  he stopped taking lisinopril   Allergies:   Metoprolol tartrate   Social History   Tobacco Use  . Smoking status: Never Smoker  . Smokeless tobacco: Never Used  Substance Use Topics  . Alcohol use: Yes    Alcohol/week: 1.0 standard drinks    Types: 1 Cans of beer per week  . Drug use: Not on file  Widowed, wife Denny Peon passed away after open heart surgery in 2020.  Family History: The patient's family history includes CAD in his father; Hyperlipidemia in his father.  ROS:   Please see the history of present illness.  Additional pertinent ROS: Constitutional: Negative for chills, fever, night sweats, unintentional weight loss  HENT: Negative for ear pain and hearing loss.   Eyes: Negative for loss of vision and eye pain.  Respiratory: Negative for cough, sputum, wheezing.   Cardiovascular: See HPI. Gastrointestinal: Negative for abdominal pain, melena, and hematochezia.  Genitourinary: Negative for dysuria and hematuria.  Musculoskeletal: Negative for falls and myalgias.  Skin: Negative for itching and rash.  Neurological: Negative for focal weakness, focal sensory changes and loss of consciousness.  Endo/Heme/Allergies: Does bruise/bleed easily.   EKGs/Labs/Other Studies Reviewed:    The following studies were reviewed today:  Echo 12/02/17 - Left ventricle: Wall thickness was increased in a pattern of   severe LVH. Systolic function was moderately to severely reduced.   The estimated ejection fraction was in the range of 30% to 35%.   Diffuse hypokinesis. - Aortic valve: There was mild regurgitation. - Left atrium: The atrium was mildly dilated. - Right ventricle: Systolic function was moderately reduced. - Right atrium: The atrium was mildly to moderately dilated.  Echo 09/22/17 Left ventricle: The  cavity size was normal. There was mild   concentric hypertrophy. Systolic function was severely reduced.   The estimated ejection fraction was in the range of 20% to 25%.   Severe diffuse hypokinesis. There is akinesis of the inferior   myocardium. There is akinesis of the mid-apicalinferolateral   myocardium. There is akinesis of the basalanteroseptal   myocardium. The study was not technically sufficient to allow   evaluation of LV diastolic dysfunction due to atrial   fibrillation. - Aortic valve: Trileaflet; normal thickness, mildly calcified   leaflets. There was mild to moderate regurgitation. - Aorta: Aortic root dimension: 38 mm (ED). - Aortic root: The aortic root was mildly dilated. - Mitral valve: There was mild regurgitation. - Left atrium: The atrium was severely dilated. - Right atrium: The atrium was massively dilated. - Pulmonary arteries: PA peak pressure: 36 mm Hg (S). - Pericardium, extracardiac: There was a left pleural effusion.  Impressions:  - The right ventricular systolic pressure was increased consistent   with mild pulmonary hypertension.  Left and right heart cath 09/24/17  There is severe left ventricular systolic dysfunction. The left ventricular  ejection fraction is less than 25% by visual estimate.  LV end diastolic pressure is moderately elevated.  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  There is mild (2+) mitral regurgitation.  --------------------------CORONARY ANATOMY--------------------------------  Colon Flattery 1st Diag lesion is 90% stenosed.  Ost 2nd Diag lesion is 85% stenosed. Ost 2nd Diag to 2nd Diag lesion is 70% stenosed.  Dist Cx lesion is 75% stenosed.  Prox LAD to Mid LAD lesion is 20% stenosed.   Angiographically minimal CAD involving small Diag/Ramus branches and apical Lateral Cx-OM Severely dilated RA and RV with only mildly elevated pulmonary pressures. Severely dilated left ventricle with severe LV  dysfunction (EF estimated may be 15 to 20%, but only mildly reduced cardiac output of 4.56 by FICK  EKG:  EKG is personally reviewed today.  The ekg ordered today shows afib at 75 bpm, low voltage limb leads  Recent Labs: No results found for requested labs within last 8760 hours.  Recent Lipid Panel No results found for: CHOL, TRIG, HDL, CHOLHDL, VLDL, LDLCALC, LDLDIRECT  Physical Exam:    VS:  BP (!) 158/82 (BP Location: Right Arm, Patient Position: Sitting, Cuff Size: Normal)   Pulse 96   Temp (!) 96.4 F (35.8 C)   Ht 6' (1.829 m)   Wt 161 lb 6.4 oz (73.2 kg)   SpO2 99%   BMI 21.89 kg/m     Wt Readings from Last 3 Encounters:  08/01/18 159 lb (72.1 kg)  04/10/18 159 lb (72.1 kg)  01/10/18 165 lb 3.2 oz (74.9 kg)    GEN: Well nourished, well developed in no acute distress HEENT: Normal, moist mucous membranes NECK: No JVD CARDIAC: irregularly irregular rhythm, normal S1 and S2, no rubs or gallops. No murmur. VASCULAR: Radial and DP pulses 2+ bilaterally. No carotid bruits RESPIRATORY:  Clear to auscultation without rales, wheezing or rhonchi  ABDOMEN: Soft, non-tender, non-distended MUSCULOSKELETAL:  Ambulates independently SKIN: Warm and dry, no edema NEUROLOGIC:  Alert and oriented x 3. No focal neuro deficits noted. PSYCHIATRIC:  Normal affect   ASSESSMENT:    1. Permanent atrial fibrillation ([place])   2. Chronic systolic heart failure ([place])   3. Nonischemic cardiomyopathy ([place])   4. Educated about COVID-19 virus infection   5. Nutritional counseling    PLAN:    Atrial fibrillation: rate controlled, permanent, asymptomatic -CHA2DS2/VAS Stroke Risk Points =3 . Continue apixaban. -rate control with carvedilol  Chronic systolic heart failure, nonischemic cardiomyopathy:  Euvolemic today. NYHA class 1 -we have discussed his preferred GDMT at length, see prior notes. He feels well on current regimen -continue carvedilol, lisinopril. Declines entreso, ICD  Recent  loss of his wife: she was my patient as well, offered my condolences again. We discussed proper nutrition now that he is solely responsible for his meals.  Covid education: he is following recommended guidelines to avoid covid. His heart failure puts him in a high risk group if/when he is interested in the vaccine.  Plan for follow up: 6 mos or sooner PRN  Medication Adjustments/Labs and Tests Ordered: Current medicines are reviewed at length with the patient today.  Concerns regarding medicines are outlined above.  Orders Placed This Encounter  Procedures  . EKG 12-Lead   Meds ordered this encounter  Medications  . apixaban (ELIQUIS) 5 MG TABS tablet    Sig: Take 1 tablet (5 mg total) by mouth 2 (two) times daily.    Dispense:  180 tablet    Refill:  3    Patient  Instructions  Medication Instructions:  Your Physician recommend you continue on your current medication as directed.    *If you need a refill on your cardiac medications before your next appointment, please call your pharmacy*  Lab Work: None  Testing/Procedures: None  Follow-Up: At [facility], you and your health needs are our priority.  As part of our continuing mission to provide you with exceptional heart care, we have created designated Provider Care Teams.  These Care Teams include your primary Cardiologist (physician) and Advanced Practice Providers (APPs -  Physician Assistants and Nurse Practitioners) who all work together to provide you with the care you need, when you need it.  Your next appointment:   6 month(s)  The format for your next appointment:   In Person  Provider:   [name], MD       Signed, [name], MD PhD 02/17/2019   [place]

## 2019-02-19 ENCOUNTER — Encounter: Payer: Self-pay | Admitting: Cardiology

## 2019-07-30 ENCOUNTER — Other Ambulatory Visit: Payer: Self-pay

## 2019-07-30 ENCOUNTER — Encounter: Payer: Self-pay | Admitting: Cardiology

## 2019-07-30 ENCOUNTER — Ambulatory Visit (INDEPENDENT_AMBULATORY_CARE_PROVIDER_SITE_OTHER): Payer: PPO | Admitting: Cardiology

## 2019-07-30 VITALS — BP 146/93 | HR 88 | Ht 72.0 in | Wt 160.4 lb

## 2019-07-30 DIAGNOSIS — Z7189 Other specified counseling: Secondary | ICD-10-CM | POA: Diagnosis not present

## 2019-07-30 DIAGNOSIS — I4821 Permanent atrial fibrillation: Secondary | ICD-10-CM

## 2019-07-30 DIAGNOSIS — I428 Other cardiomyopathies: Secondary | ICD-10-CM | POA: Diagnosis not present

## 2019-07-30 DIAGNOSIS — I5022 Chronic systolic (congestive) heart failure: Secondary | ICD-10-CM

## 2019-07-30 NOTE — Patient Instructions (Signed)

## 2019-07-30 NOTE — Progress Notes (Signed)
Cardiology Office Note:    Date:  07/30/2019   ID:  [name], DOB 02/19/1941, MRN [id number]  PCP:  Christain Sacramento, MD  Cardiologist:  [name], MD PhD  Referring MD: Christain Sacramento, MD   CC: follow up  History of Present Illness:    Ruble Wilden is a 79 y.o. male with a hx of atrial fibrillation (presented with RVR) and acute systolic and diastolic heart failure 123XX123. He presents today for follow up.  Please see discharge summary from 09/25/17 for full details, and study information included below. Briefly, Mr. Shreffler had a several month history of dyspnea on exertion. He presented with afib RVR and was found to have flatly elevated troponins, elevated BNP, and bilateral pleural effusion. During his hospitalization he had an echo which showed reduced ejection fraction. 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 He was diuresed and started on medications. Of note, please see my notes during his hospitalization--we had long, extensive conversations regarding guideline medical therapy. Mr. Legel has strong personal beliefs regarding medications and supplements to manage his health. As per my notes, his decision regarding medications was to take anticoagulation for atrial fibrillation, rate control and systolic heart failure management with carvedilol, and heart failure management with lisinopril. He declined TEE/CV, aspirin, statin, or entresto.  Today: Coping well with the loss of his wife. Daughter lives near town, has support system.  Does check BP at home routinely, no log today. Takes cinnamon to prevent blood pressure elevation. When he does check BP at home, it is well controlled. Typically runs on the low side.   No bleeding issues with apixaban. Tolerating lisinopril and carvedilol at low doses. Wants to try to treat with supplements and natural  remedies.  Denies chest pain, shortness of breath at rest or with normal exertion. No PND, orthopnea, LE edema or unexpected weight gain. No syncope or palpitations.  ROS positive for joint arthritis, but he is able to continue to work at his house and in the yard.   Past Medical History:  Diagnosis Date   Atrial fibrillation ([place])    Chronic kidney disease    Creatinine 1.2 in 2012   Hypertension    Diet controlled in the past    Past Surgical History:  Procedure Laterality Date   INGUINAL HERNIA REPAIR     bilateral   RIGHT/LEFT HEART CATH AND CORONARY ANGIOGRAPHY N/A 09/24/2017   Procedure: RIGHT/LEFT HEART CATH AND CORONARY ANGIOGRAPHY;  Surgeon: Leonie Man, MD;  Location: [facility];  Service: Cardiovascular;  Laterality: N/A;   TONSILLECTOMY      Current Medications: Current Outpatient Medications on File Prior to Visit  Medication Sig   apixaban (ELIQUIS) 5 MG TABS tablet Take 1 tablet (5 mg total) by mouth 2 (two) times daily.   Ascorbic Acid (VITAMIN C) 100 MG tablet Take 1,000 mg by mouth daily.   B Complex Vitamins (VITAMIN B COMPLEX PO) Take 1 tablet by mouth daily.   carvedilol (COREG) 6.25 MG tablet TAKE ONE TABLET TWICE DAILY WITH A MEAL   Cholecalciferol (VITAMIN D-1000 MAX ST) 1000 units tablet Take 1,000 Units by mouth daily.   Co-Enzyme Q-10 30 MG CAPS Take 30 mg by mouth daily.   Lactobacillus (PROBIOTIC ACIDOPHILUS PO) Take 1 tablet by mouth daily.   lisinopril (ZESTRIL) 2.5 MG tablet TAKE ONE TABLET EVERY DAY   Magnesium Gluconate  550 MG TABS Take 550 mg by mouth daily.   Omega-3 Fatty Acids (RA FISH OIL) 1000 MG CAPS Take 1,000 mg by mouth daily.   omeprazole (PRILOSEC) 20 MG capsule Take 1 capsule (20 mg total) by mouth daily.   zinc gluconate 50 MG tablet Take 50 mg by mouth 3 (three) times a week.   No current facility-administered medications on file prior to visit.  he stopped taking lisinopril   Allergies:    Metoprolol tartrate   Social History   Tobacco Use   Smoking status: Never Smoker   Smokeless tobacco: Never Used  Substance Use Topics   Alcohol use: Yes    Alcohol/week: 1.0 standard drinks    Types: 1 Cans of beer per week   Drug use: Not on file  Widowed, wife Denny Peon passed away after open heart surgery in 2020.  Family History: The patient's family history includes CAD in his father; Hyperlipidemia in his father.  ROS:   Please see the history of present illness.  Additional pertinent ROS otherwise unremarkable.  EKGs/Labs/Other Studies Reviewed:    The following studies were reviewed today:  Echo 12/02/17 - Left ventricle: Wall thickness was increased in a pattern of   severe LVH. Systolic function was moderately to severely reduced.   The estimated ejection fraction was in the range of 30% to 35%.   Diffuse hypokinesis. - Aortic valve: There was mild regurgitation. - Left atrium: The atrium was mildly dilated. - Right ventricle: Systolic function was moderately reduced. - Right atrium: The atrium was mildly to moderately dilated.  Echo 09/22/17 Left ventricle: The cavity size was normal. There was mild   concentric hypertrophy. Systolic function was severely reduced.   The estimated ejection fraction was in the range of 20% to 25%.   Severe diffuse hypokinesis. There is akinesis of the inferior   myocardium. There is akinesis of the mid-apicalinferolateral   myocardium. There is akinesis of the basalanteroseptal   myocardium. The study was not technically sufficient to allow   evaluation of LV diastolic dysfunction due to atrial   fibrillation. - Aortic valve: Trileaflet; normal thickness, mildly calcified   leaflets. There was mild to moderate regurgitation. - Aorta: Aortic root dimension: 38 mm (ED). - Aortic root: The aortic root was mildly dilated. - Mitral valve: There was mild regurgitation. - Left atrium: The atrium was severely dilated. - Right atrium:  The atrium was massively dilated. - Pulmonary arteries: PA peak pressure: 36 mm Hg (S). - Pericardium, extracardiac: There was a left pleural effusion.  Impressions:  - The right ventricular systolic pressure was increased consistent   with mild pulmonary hypertension.  Left and right heart cath 09/24/17  There is severe left ventricular systolic dysfunction. The left ventricular ejection fraction is less than 25% by visual estimate.  LV end diastolic pressure is moderately elevated.  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  There is mild (2+) mitral regurgitation.  --------------------------CORONARY ANATOMY--------------------------------  Colon Flattery 1st Diag lesion is 90% stenosed.  Ost 2nd Diag lesion is 85% stenosed. Ost 2nd Diag to 2nd Diag lesion is 70% stenosed.  Dist Cx lesion is 75% stenosed.  Prox LAD to Mid LAD lesion is 20% stenosed.   Angiographically minimal CAD involving small Diag/Ramus branches and apical Lateral Cx-OM Severely dilated RA and RV with only mildly elevated pulmonary pressures. Severely dilated left ventricle with severe LV dysfunction (EF estimated may be 15 to 20%, but only mildly reduced cardiac output of 4.56 by [facility]  EKG:  EKG is personally reviewed today.  The ekg ordered 02/17/19 shows afib at 75 bpm, low voltage limb leads  Recent Labs: No results found for requested labs within last 8760 hours.  Recent Lipid Panel No results found for: CHOL, TRIG, HDL, CHOLHDL, VLDL, LDLCALC, LDLDIRECT  Physical Exam:    VS:  BP (!) 146/93    Pulse 88    Ht 6' (1.829 m)    Wt 160 lb 6.4 oz (72.8 kg)    SpO2 98%    BMI 21.75 kg/m     Wt Readings from Last 3 Encounters:  07/30/19 160 lb 6.4 oz (72.8 kg)  02/17/19 161 lb 6.4 oz (73.2 kg)  08/01/18 159 lb (72.1 kg)    GEN: Well nourished, well developed in no acute distress HEENT: Normal, moist mucous membranes NECK: No JVD CARDIAC: irregularly irregular rhythm, normal S1 and  S2, no rubs or gallops. No murmur. VASCULAR: Radial and DP pulses 2+ bilaterally. No carotid bruits RESPIRATORY:  Clear to auscultation without rales, wheezing or rhonchi  ABDOMEN: Soft, non-tender, non-distended MUSCULOSKELETAL:  Ambulates independently SKIN: Warm and dry, no edema NEUROLOGIC:  Alert and oriented x 3. No focal neuro deficits noted. PSYCHIATRIC:  Normal affect   ASSESSMENT:    1. Permanent atrial fibrillation ([place])   2. Chronic systolic heart failure ([place])   3. Nonischemic cardiomyopathy ([place])   4. Educated about COVID-19 virus infection    PLAN:    Atrial fibrillation: rate controlled, permanent, asymptomatic -CHA2DS2/VAS Stroke Risk Points =3 . Continue apixaban. -rate control with carvedilol  Chronic systolic heart failure, nonischemic cardiomyopathy:   -euvolemic, NYHA class 1-2 -we have discussed his preferred GDMT at length, see prior notes. He feels well on current regimen -continue carvedilol, lisinopril. Declines entreso, ICD  Covid education: he is following recommended guidelines to avoid covid. His heart failure puts him in a high risk group if/when he is interested in the vaccine. He is not interested at this time.  Plan for follow up: 6 mos or sooner PRN  Medication Adjustments/Labs and Tests Ordered: Current medicines are reviewed at length with the patient today.  Concerns regarding medicines are outlined above.  No orders of the defined types were placed in this encounter.  No orders of the defined types were placed in this encounter.   Patient Instructions  Medication Instructions:  Your Physician recommend you continue on your current medication as directed.    *If you need a refill on your cardiac medications before your next appointment, please call your pharmacy*   Lab Work: None   Testing/Procedures: None   Follow-Up: At [facility], you and your health needs are our priority.  As part of our continuing mission to  provide you with exceptional heart care, we have created designated Provider Care Teams.  These Care Teams include your primary Cardiologist (physician) and Advanced Practice Providers (APPs -  Physician Assistants and Nurse Practitioners) who all work together to provide you with the care you need, when you need it.  We recommend signing up for the patient portal called "MyChart".  Sign up information is provided on this After Visit Summary.  MyChart is used to connect with patients for Virtual Visits (Telemedicine).  Patients are able to view lab/test results, encounter notes, upcoming appointments, etc.  Non-urgent messages can be sent to your provider as well.   To learn more about what you can do with MyChart, go to [email or web address].    Your next appointment:   6  month(s)  The format for your next appointment:   In Person  Provider:   [name], MD        Signed, [name], MD PhD 07/30/2019   [place]

## 2019-10-05 ENCOUNTER — Encounter: Payer: Self-pay | Admitting: Cardiology

## 2019-11-09 ENCOUNTER — Other Ambulatory Visit: Payer: Self-pay | Admitting: Cardiology

## 2020-02-05 ENCOUNTER — Telehealth: Payer: Self-pay | Admitting: Licensed Clinical Social Worker

## 2020-02-05 ENCOUNTER — Encounter: Payer: Self-pay | Admitting: Cardiology

## 2020-02-05 ENCOUNTER — Ambulatory Visit (INDEPENDENT_AMBULATORY_CARE_PROVIDER_SITE_OTHER): Payer: PPO | Admitting: Cardiology

## 2020-02-05 ENCOUNTER — Other Ambulatory Visit: Payer: Self-pay

## 2020-02-05 VITALS — BP 168/72 | HR 87 | Ht 72.0 in | Wt 160.4 lb

## 2020-02-05 DIAGNOSIS — D6869 Other thrombophilia: Secondary | ICD-10-CM | POA: Diagnosis not present

## 2020-02-05 DIAGNOSIS — Z79899 Other long term (current) drug therapy: Secondary | ICD-10-CM | POA: Diagnosis not present

## 2020-02-05 DIAGNOSIS — I5022 Chronic systolic (congestive) heart failure: Secondary | ICD-10-CM

## 2020-02-05 DIAGNOSIS — I428 Other cardiomyopathies: Secondary | ICD-10-CM

## 2020-02-05 DIAGNOSIS — I4821 Permanent atrial fibrillation: Secondary | ICD-10-CM | POA: Diagnosis not present

## 2020-02-05 MED ORDER — CARVEDILOL 6.25 MG PO TABS: 6.2500 mg | ORAL_TABLET | Freq: Two times a day (BID) | ORAL | 3 refills | Status: DC

## 2020-02-05 MED ORDER — APIXABAN 5 MG PO TABS: 5.0000 mg | ORAL_TABLET | Freq: Two times a day (BID) | ORAL | 3 refills | Status: DC

## 2020-02-05 MED ORDER — LISINOPRIL 2.5 MG PO TABS: 2.5000 mg | ORAL_TABLET | Freq: Every day | ORAL | 3 refills | Status: DC

## 2020-02-05 NOTE — Telephone Encounter (Signed)
CSW spoke with Austin Mage, RN, w/ Dr. Harrell Gave. Per referral pt previously had the assistance of his wife to file for PAP for Eliquis but she unfortunately has died and he would benefit from some assistance. CSW spoke with [place] who previously had been involved w/ pt. New referral for pt may be beneficial for some community care management. Provided information about referral to [facility] and will assist with calling pt pharmacy to assist with getting copay reciepts for PAP renewal form.   Westley Hummer, MSW, [place]  [phone]

## 2020-02-05 NOTE — Progress Notes (Signed)
Cardiology Office Note:    Date:  02/05/2020   ID:  Austin Wong, DOB [birth year], MRN [id number]  PCP:  Christain Sacramento, MD  Cardiologist:  [name], MD PhD  Referring MD: Christain Sacramento, MD   CC: follow up  History of Present Illness:    Austin Wong is a 79 y.o. male with a hx of atrial fibrillation (presented with RVR) and acute systolic and diastolic heart failure 09/1015. He presents today for follow up.  Please see discharge summary from 09/25/17 for full details, and study information included below. Briefly, Austin Wong had a several month history of dyspnea on exertion. He presented with afib RVR and was found to have flatly elevated troponins, elevated BNP, and bilateral pleural effusion. During his hospitalization he had an echo which showed reduced ejection fraction. 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 He was diuresed and started on medications. Of note, please see my notes during his hospitalization--we had long, extensive conversations regarding guideline medical therapy. Austin Wong has strong personal beliefs regarding medications and supplements to manage his health. As per my notes, his decision regarding medications was to take anticoagulation for atrial fibrillation, rate control and systolic heart failure management with carvedilol, and heart failure management with lisinopril. He declined TEE/CV, aspirin, statin, or entresto.  Coping well with the loss of his wife. Daughter lives near town, has support system.  Today: Tried acupuncture for his afib, but remains in afib today. No bleeding with apixaban. BP at home have been 108-110/70s. Rarely 98/60s. Takes cinnamon when his blood pressure is a little high, well controlled on this. He is taking lisinopril and carvedilol at home.  Denies chest pain, shortness of breath at rest or with normal exertion.  No PND, orthopnea, LE edema or unexpected weight gain. No syncope or palpitations.  Past Medical History:  Diagnosis Date   Atrial fibrillation ([place])    Chronic kidney disease    Creatinine 1.2 in 2012   Hypertension    Diet controlled in the past    Past Surgical History:  Procedure Laterality Date   INGUINAL HERNIA REPAIR     bilateral   RIGHT/LEFT HEART CATH AND CORONARY ANGIOGRAPHY N/A 09/24/2017   Procedure: RIGHT/LEFT HEART CATH AND CORONARY ANGIOGRAPHY;  Surgeon: Leonie Man, MD;  Location: [facility];  Service: Cardiovascular;  Laterality: N/A;   TONSILLECTOMY      Current Medications: Current Outpatient Medications on File Prior to Visit  Medication Sig   apixaban (ELIQUIS) 5 MG TABS tablet Take 1 tablet (5 mg total) by mouth 2 (two) times daily.   Ascorbic Acid (VITAMIN C) 100 MG tablet Take 1,000 mg by mouth daily.   B Complex Vitamins (VITAMIN B COMPLEX PO) Take 1 tablet by mouth daily.   carvedilol (COREG) 6.25 MG tablet TAKE ONE TABLET BY MOUTH TWICE DAILY WITH A MEAL   Cholecalciferol 25 MCG (1000 UT) tablet Take 1,000 Units by mouth daily.   Co-Enzyme Q-10 30 MG CAPS Take 30 mg by mouth daily.   Lactobacillus (PROBIOTIC ACIDOPHILUS PO) Take 1 tablet by mouth daily.   Magnesium Gluconate 550 MG TABS Take 550 mg by mouth daily.   Omega-3 Fatty Acids (RA FISH OIL) 1000 MG CAPS Take 1,000 mg by mouth daily.   zinc gluconate 50 MG tablet Take 50 mg by mouth 3 (three) times a week.   No current  facility-administered medications on file prior to visit.  he stopped taking lisinopril   Allergies:   Metoprolol tartrate   Social History   Tobacco Use   Smoking status: Never Smoker   Smokeless tobacco: Never Used  Vaping Use   Vaping Use: Never used  Substance Use Topics   Alcohol use: Yes    Alcohol/week: 1.0 standard drink    Types: 1 Cans of beer per week  Widowed, wife Austin Wong passed away after open heart surgery in 2020.  Family History: The  patient's family history includes CAD in his father; Hyperlipidemia in his father.  ROS:   Please see the history of present illness.  Additional pertinent ROS otherwise unremarkable.  EKGs/Labs/Other Studies Reviewed:    The following studies were reviewed today:  Echo 12/02/17 - Left ventricle: Wall thickness was increased in a pattern of   severe LVH. Systolic function was moderately to severely reduced.   The estimated ejection fraction was in the range of 30% to 35%.   Diffuse hypokinesis. - Aortic valve: There was mild regurgitation. - Left atrium: The atrium was mildly dilated. - Right ventricle: Systolic function was moderately reduced. - Right atrium: The atrium was mildly to moderately dilated.  Echo 09/22/17 Left ventricle: The cavity size was normal. There was mild   concentric hypertrophy. Systolic function was severely reduced.   The estimated ejection fraction was in the range of 20% to 25%.   Severe diffuse hypokinesis. There is akinesis of the inferior   myocardium. There is akinesis of the mid-apicalinferolateral   myocardium. There is akinesis of the basalanteroseptal   myocardium. The study was not technically sufficient to allow   evaluation of LV diastolic dysfunction due to atrial   fibrillation. - Aortic valve: Trileaflet; normal thickness, mildly calcified   leaflets. There was mild to moderate regurgitation. - Aorta: Aortic root dimension: 38 mm (ED). - Aortic root: The aortic root was mildly dilated. - Mitral valve: There was mild regurgitation. - Left atrium: The atrium was severely dilated. - Right atrium: The atrium was massively dilated. - Pulmonary arteries: PA peak pressure: 36 mm Hg (S). - Pericardium, extracardiac: There was a left pleural effusion.   Impressions:   - The right ventricular systolic pressure was increased consistent   with mild pulmonary hypertension.  Left and right heart cath 09/24/17 There is severe left ventricular  systolic dysfunction. The left ventricular ejection fraction is less than 25% by visual estimate. LV end diastolic pressure is moderately elevated. 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 There is mild (2+) mitral regurgitation. --------------------------CORONARY ANATOMY-------------------------------- Colon Flattery 1st Diag lesion is 90% stenosed. Ost 2nd Diag lesion is 85% stenosed. Ost 2nd Diag to 2nd Diag lesion is 70% stenosed. Dist Cx lesion is 75% stenosed. Prox LAD to Mid LAD lesion is 20% stenosed.   Angiographically minimal CAD involving small Diag/Ramus branches and apical Lateral Cx-OM Severely dilated RA and RV with only mildly elevated pulmonary pressures. Severely dilated left ventricle with severe LV dysfunction (EF estimated may be 15 to 20%, but only mildly reduced cardiac output of 4.56 by FICK  EKG:  EKG is personally reviewed today.   02/05/20 Atrial fibrillation at 87 bpm  Recent Labs: No results found for requested labs within last 8760 hours.  Recent Lipid Panel No results found for: CHOL, TRIG, HDL, CHOLHDL, VLDL, LDLCALC, LDLDIRECT  Physical Exam:    VS:  BP (!) 168/72    Pulse 87    Ht 6' (1.829 m)  Wt 160 lb 6.4 oz (72.8 kg)    SpO2 98%    BMI 21.75 kg/m     Wt Readings from Last 3 Encounters:  02/05/20 160 lb 6.4 oz (72.8 kg)  07/30/19 160 lb 6.4 oz (72.8 kg)  02/17/19 161 lb 6.4 oz (73.2 kg)    GEN: Well nourished, well developed in no acute distress HEENT: Normal, moist mucous membranes NECK: No JVD CARDIAC: irregularly irregular rhythm, normal S1 and S2, no rubs or gallops. No murmur. VASCULAR: Radial and DP pulses 2+ bilaterally. No carotid bruits RESPIRATORY:  Clear to auscultation without rales, wheezing or rhonchi  ABDOMEN: Soft, non-tender, non-distended MUSCULOSKELETAL:  Ambulates independently SKIN: Warm and dry, no edema NEUROLOGIC:  Alert and oriented x 3. No focal neuro deficits noted. PSYCHIATRIC:  Normal affect    ASSESSMENT:    1. Permanent atrial fibrillation ([place])   2. Chronic systolic heart failure ([place])   3. Nonischemic cardiomyopathy ([place])   4. Secondary hypercoagulable state ([place])   5. Medication management    PLAN:    Atrial fibrillation: rate controlled, permanent, asymptomatic -CHA2DS2/VAS Stroke Risk Points =3 . Continue apixaban. -rate control with carvedilol  Chronic systolic heart failure, nonischemic cardiomyopathy:   -euvolemic, NYHA class 1-2 -we have discussed his preferred GDMT at length, see prior notes. He feels well on current regimen -continue carvedilol -he has stopped lisinopril. Reports this was stopped due to low BP. Elevated today. Monitor -Declines entreso, ICD  Hypertension, likely white coat component -endorses excellent control at home -reviewed how to monitor -I recommended restarting lisinopril today -he will contact me if pressures remain about goal  Covid education: he is following recommended guidelines to avoid covid. His heart failure puts him in a high risk group if/when he is interested in the vaccine. He is not interested at this time.  Plan for follow up: 6 mos or sooner PRN  Medication Adjustments/Labs and Tests Ordered: Current medicines are reviewed at length with the patient today.  Concerns regarding medicines are outlined above.  Orders Placed This Encounter  Procedures   Lipid panel   CBC   Basic metabolic panel   EKG [id number]   Meds ordered this encounter  Medications   DISCONTD: lisinopril (ZESTRIL) 2.5 MG tablet    Sig: Take 1 tablet (2.5 mg total) by mouth daily.    Dispense:  90 tablet    Refill:  3   DISCONTD: carvedilol (COREG) 6.25 MG tablet    Sig: Take 1 tablet (6.25 mg total) by mouth 2 (two) times daily with a meal.    Dispense:  180 tablet    Refill:  3   DISCONTD: apixaban (ELIQUIS) 5 MG TABS tablet    Sig: Take 1 tablet (5 mg total) by mouth 2 (two) times daily.    Dispense:  180 tablet    Refill:  3     Patient Instructions  Medication Instructions:  Your Physician recommend you continue on your current medication as directed.    *If you need a refill on your cardiac medications before your next appointment, please call your pharmacy*   Lab Work: Your physician recommends lab work today (BMP,CBC, Lipid).  If you have labs (blood work) drawn today and your tests are completely normal, you will receive your results only by: [place] (if you have MyChart) OR A paper copy in the mail If you have any lab test that is abnormal or we need to change your treatment, we will call you to review  the results.   Testing/Procedures: None   Follow-Up: At [facility], you and your health needs are our priority.  As part of our continuing mission to provide you with exceptional heart care, we have created designated Provider Care Teams.  These Care Teams include your primary Cardiologist (physician) and Advanced Practice Providers (APPs -  Physician Assistants and Nurse Practitioners) who all work together to provide you with the care you need, when you need it.  We recommend signing up for the patient portal called "MyChart".  Sign up information is provided on this After Visit Summary.  MyChart is used to connect with patients for Virtual Visits (Telemedicine).  Patients are able to view lab/test results, encounter notes, upcoming appointments, etc.  Non-urgent messages can be sent to your provider as well.   To learn more about what you can do with MyChart, go to [email or web address].    Your next appointment:   6 month(s)  The format for your next appointment:   In Person  Provider:   [name], MD      Signed, [name], MD PhD 02/05/2020   [place]

## 2020-02-05 NOTE — Patient Instructions (Signed)
Medication Instructions:  Your Physician recommend you continue on your current medication as directed.    *If you need a refill on your cardiac medications before your next appointment, please call your pharmacy*   Lab Work: Your physician recommends lab work today (BMP,CBC, Lipid).  If you have labs (blood work) drawn today and your tests are completely normal, you will receive your results only by: [place] MyChart Message (if you have MyChart) OR . A paper copy in the mail If you have any lab test that is abnormal or we need to change your treatment, we will call you to review the results.   Testing/Procedures: None   Follow-Up: At [facility], you and your health needs are our priority.  As part of our continuing mission to provide you with exceptional heart care, we have created designated Provider Care Teams.  These Care Teams include your primary Cardiologist (physician) and Advanced Practice Providers (APPs -  Physician Assistants and Nurse Practitioners) who all work together to provide you with the care you need, when you need it.  We recommend signing up for the patient portal called "MyChart".  Sign up information is provided on this After Visit Summary.  MyChart is used to connect with patients for Virtual Visits (Telemedicine).  Patients are able to view lab/test results, encounter notes, upcoming appointments, etc.  Non-urgent messages can be sent to your provider as well.   To learn more about what you can do with MyChart, go to [email or web address].    Your next appointment:   6 month(s)  The format for your next appointment:   In Person  Provider:   [name], MD

## 2020-02-06 LAB — LIPID PANEL
Chol/HDL Ratio: 3.7 ratio (ref 0.0–5.0)
Cholesterol, Total: 176 mg/dL (ref 100–199)
HDL: 48 mg/dL (ref >39)
LDL Chol Calc (NIH): 117 mg/dL — ABNORMAL HIGH (ref 0–99)
Triglycerides: 59 mg/dL (ref 0–149)
VLDL Cholesterol Cal: 11 mg/dL (ref 5–40)

## 2020-02-06 LAB — CBC
Hematocrit: 42.4 % (ref 37.5–51.0)
Hemoglobin: 14.6 g/dL (ref 13.0–17.7)
MCH: 31.7 pg (ref 26.6–33.0)
MCHC: 34.4 g/dL (ref 31.5–35.7)
MCV: 92 fL (ref 79–97)
Platelets: 147 10*3/uL — ABNORMAL LOW (ref 150–450)
RBC: 4.6 x10E6/uL (ref 4.14–5.80)
RDW: 13.1 % (ref 11.6–15.4)
WBC: 5.8 10*3/uL (ref 3.4–10.8)

## 2020-02-06 LAB — BASIC METABOLIC PANEL
BUN/Creatinine Ratio: 16 (ref 10–24)
BUN: 16 mg/dL (ref 8–27)
CO2: 27 mmol/L (ref 20–29)
Calcium: 9.8 mg/dL (ref 8.6–10.2)
Chloride: 102 mmol/L (ref 96–106)
Creatinine, Ser: 0.99 mg/dL (ref 0.76–1.27)
GFR calc Af Amer: 83 mL/min/{1.73_m2} (ref >59)
GFR calc non Af Amer: 72 mL/min/{1.73_m2} (ref >59)
Glucose: 104 mg/dL — ABNORMAL HIGH (ref 65–99)
Potassium: 4.7 mmol/L (ref 3.5–5.2)
Sodium: 141 mmol/L (ref 134–144)

## 2020-02-15 ENCOUNTER — Other Ambulatory Visit: Payer: Self-pay

## 2020-02-15 DIAGNOSIS — I4821 Permanent atrial fibrillation: Secondary | ICD-10-CM

## 2020-02-15 MED ORDER — APIXABAN 5 MG PO TABS: 5.0000 mg | ORAL_TABLET | Freq: Two times a day (BID) | ORAL | 1 refills | Status: DC

## 2020-03-10 ENCOUNTER — Telehealth: Payer: Self-pay | Admitting: Cardiology

## 2020-03-10 NOTE — Telephone Encounter (Signed)
I haven't seen paperwork yet but I will keep an eye out for it. Thanks.

## 2020-03-10 NOTE — Telephone Encounter (Signed)
     Pt c/o medication issue:  1. Name of Medication:   apixaban (ELIQUIS) 5 MG TABS tablet    2. How are you currently taking this medication (dosage and times per day)? Take 1 tablet (5 mg total) by mouth 2 (two) times daily.  3. Are you having a reaction (difficulty breathing--STAT)?   4. What is your medication issue? Pt said per [employer] [employer] squibb they will send paperwork to Dr. Harrell Gave to renew his grant for his eliquis. He wanted to check if Dr. Harrell Gave did receive it and what he needs to do on his end

## 2020-03-13 ENCOUNTER — Other Ambulatory Visit: Payer: Self-pay

## 2020-03-13 ENCOUNTER — Emergency Department (HOSPITAL_COMMUNITY)
Admission: EM | Admit: 2020-03-13 | Discharge: 2020-03-13 | Disposition: A | Discharge status: Home / Self Care | Payer: PPO | Attending: Emergency Medicine | Admitting: Emergency Medicine

## 2020-03-13 ENCOUNTER — Encounter (HOSPITAL_COMMUNITY): Payer: Self-pay | Admitting: Emergency Medicine

## 2020-03-13 ENCOUNTER — Emergency Department (HOSPITAL_COMMUNITY): Payer: PPO

## 2020-03-13 DIAGNOSIS — I5022 Chronic systolic (congestive) heart failure: Secondary | ICD-10-CM | POA: Diagnosis not present

## 2020-03-13 DIAGNOSIS — I517 Cardiomegaly: Secondary | ICD-10-CM | POA: Diagnosis not present

## 2020-03-13 DIAGNOSIS — R531 Weakness: Secondary | ICD-10-CM | POA: Diagnosis not present

## 2020-03-13 DIAGNOSIS — Z7901 Long term (current) use of anticoagulants: Secondary | ICD-10-CM | POA: Diagnosis not present

## 2020-03-13 DIAGNOSIS — R52 Pain, unspecified: Secondary | ICD-10-CM | POA: Diagnosis not present

## 2020-03-13 DIAGNOSIS — I130 Hypertensive heart and chronic kidney disease with heart failure and stage 1 through stage 4 chronic kidney disease, or unspecified chronic kidney disease: Secondary | ICD-10-CM | POA: Diagnosis not present

## 2020-03-13 DIAGNOSIS — I4821 Permanent atrial fibrillation: Secondary | ICD-10-CM | POA: Insufficient documentation

## 2020-03-13 DIAGNOSIS — I4891 Unspecified atrial fibrillation: Secondary | ICD-10-CM | POA: Diagnosis not present

## 2020-03-13 DIAGNOSIS — N189 Chronic kidney disease, unspecified: Secondary | ICD-10-CM | POA: Insufficient documentation

## 2020-03-13 DIAGNOSIS — Z79899 Other long term (current) drug therapy: Secondary | ICD-10-CM | POA: Diagnosis not present

## 2020-03-13 DIAGNOSIS — R0902 Hypoxemia: Secondary | ICD-10-CM | POA: Diagnosis not present

## 2020-03-13 DIAGNOSIS — U071 COVID-19: Secondary | ICD-10-CM | POA: Insufficient documentation

## 2020-03-13 DIAGNOSIS — R4182 Altered mental status, unspecified: Secondary | ICD-10-CM | POA: Diagnosis not present

## 2020-03-13 LAB — HEPATIC FUNCTION PANEL
ALT: 21 U/L (ref 0–44)
AST: 41 U/L (ref 15–41)
Albumin: 3 g/dL — ABNORMAL LOW (ref 3.5–5.0)
Alkaline Phosphatase: 53 U/L (ref 38–126)
Bilirubin, Direct: 0.4 mg/dL — ABNORMAL HIGH (ref 0.0–0.2)
Indirect Bilirubin: 1.3 mg/dL — ABNORMAL HIGH (ref 0.3–0.9)
Total Bilirubin: 1.7 mg/dL — ABNORMAL HIGH (ref 0.3–1.2)
Total Protein: 6 g/dL — ABNORMAL LOW (ref 6.5–8.1)

## 2020-03-13 LAB — BASIC METABOLIC PANEL
Anion gap: 11 (ref 5–15)
BUN: 18 mg/dL (ref 8–23)
CO2: 25 mmol/L (ref 22–32)
Calcium: 8.6 mg/dL — ABNORMAL LOW (ref 8.9–10.3)
Chloride: 98 mmol/L (ref 98–111)
Creatinine, Ser: 1.12 mg/dL (ref 0.61–1.24)
GFR, Estimated: >60 mL/min (ref >60)
Glucose, Bld: 88 mg/dL (ref 70–99)
Potassium: 4.3 mmol/L (ref 3.5–5.1)
Sodium: 134 mmol/L — ABNORMAL LOW (ref 135–145)

## 2020-03-13 LAB — CBC
HCT: 40.8 % (ref 39.0–52.0)
Hemoglobin: 14.2 g/dL (ref 13.0–17.0)
MCH: 31.6 pg (ref 26.0–34.0)
MCHC: 34.8 g/dL (ref 30.0–36.0)
MCV: 90.7 fL (ref 80.0–100.0)
Platelets: 107 10*3/uL — ABNORMAL LOW (ref 150–400)
RBC: 4.5 MIL/uL (ref 4.22–5.81)
RDW: 12.8 % (ref 11.5–15.5)
WBC: 4.2 10*3/uL (ref 4.0–10.5)
nRBC: 0 % (ref 0.0–0.2)

## 2020-03-13 LAB — URINALYSIS, ROUTINE W REFLEX MICROSCOPIC
Bilirubin Urine: NEGATIVE
Glucose, UA: NEGATIVE mg/dL
Hgb urine dipstick: NEGATIVE
Ketones, ur: 20 mg/dL — AB
Leukocytes,Ua: NEGATIVE
Nitrite: NEGATIVE
Protein, ur: NEGATIVE mg/dL
Specific Gravity, Urine: 1.018 (ref 1.005–1.030)
pH: 5 (ref 5.0–8.0)

## 2020-03-13 LAB — TROPONIN I (HIGH SENSITIVITY)
Troponin I (High Sensitivity): 149 ng/L (ref <18)
Troponin I (High Sensitivity): 151 ng/L (ref <18)

## 2020-03-13 LAB — RESP PANEL BY RT-PCR (FLU A&B, COVID) ARPGX2
Influenza A by PCR: NEGATIVE
Influenza B by PCR: NEGATIVE
SARS Coronavirus 2 by RT PCR: POSITIVE — AB

## 2020-03-13 LAB — LIPASE, BLOOD: Lipase: 24 U/L (ref 11–51)

## 2020-03-13 MED ORDER — SODIUM CHLORIDE 0.9 % IV BOLUS
1000.0000 mL | Freq: Once | INTRAVENOUS | Status: AC
  Administered 2020-03-13: 1000 mL via INTRAVENOUS

## 2020-03-13 MED ORDER — CARVEDILOL 3.125 MG PO TABS
6.2500 mg | ORAL_TABLET | Freq: Once | ORAL | Status: AC
  Administered 2020-03-13: 6.25 mg via ORAL
  Filled 2020-03-13: qty 2

## 2020-03-13 NOTE — Discharge Instructions (Signed)
You tested positive for COVID.  Please continue to monitor respiratory symptoms at home.  If you develop any severe shortness of breath or other concerns please return to the emergency department.  Call [facility] to arrange follow-up as well.

## 2020-03-13 NOTE — ED Triage Notes (Signed)
Pt to triage via [facility] from home.  Reports generalized weakness and decreased appetite x 2 weeks.  Reports a few episodes of nausea over the last week.

## 2020-03-13 NOTE — ED Notes (Signed)
[place] Police, daughter, [phone] would like to speak to nurse/doctor

## 2020-03-13 NOTE — ED Provider Notes (Addendum)
[facility] EMERGENCY DEPARTMENT Provider Note   CSN: [id number] Arrival date & time: 03/13/20  1010     History Chief Complaint  Patient presents with  . Weakness    Austin Wong is a 80 y.o. male.  The history is provided by the patient.  Weakness Severity:  Moderate Onset quality:  Gradual Duration:  2 weeks Timing:  Constant Progression:  Unchanged Chronicity:  New Context: decreased sleep   Relieved by:  Nothing Worsened by:  Nothing Associated symptoms: no abdominal pain, no arthralgias, no chest pain, no cough, no dysuria, no fever, no seizures, no shortness of breath and no vomiting   Risk factors: no coronary artery disease        Past Medical History:  Diagnosis Date  . Atrial fibrillation ([place])   . Chronic kidney disease    Creatinine 1.2 in 2012  . Hypertension    Diet controlled in the past    Patient Active Problem List   Diagnosis Date Noted  . Decreased exercise tolerance 08/20/2018  . Chronic systolic heart failure ([place]) 01/13/2018  . Nonischemic cardiomyopathy ([place]) 01/13/2018  . Permanent atrial fibrillation   . Essential hypertension   . Chronic kidney disease     Past Surgical History:  Procedure Laterality Date  . INGUINAL HERNIA REPAIR     bilateral  . RIGHT/LEFT HEART CATH AND CORONARY ANGIOGRAPHY N/A 09/24/2017   Procedure: RIGHT/LEFT HEART CATH AND CORONARY ANGIOGRAPHY;  Surgeon: Leonie Man, MD;  Location: [facility];  Service: Cardiovascular;  Laterality: N/A;  . TONSILLECTOMY         Family History  Problem Relation Age of Onset  . CAD Father   . Hyperlipidemia Father     Social History   Tobacco Use  . Smoking status: Never Smoker  . Smokeless tobacco: Never Used  Vaping Use  . Vaping Use: Never used  Substance Use Topics  . Alcohol use: Yes    Alcohol/week: 1.0 standard drink    Types: 1 Cans of beer per week    Home Medications Prior to Admission medications   Medication  Sig Start Date End Date Taking? Authorizing Provider  apixaban (ELIQUIS) 5 MG TABS tablet Take 1 tablet (5 mg total) by mouth 2 (two) times daily. 02/15/20   [name], MD  Ascorbic Acid (VITAMIN C) 100 MG tablet Take 1,000 mg by mouth daily.    [provider]  B Complex Vitamins (VITAMIN B COMPLEX PO) Take 1 tablet by mouth daily.    [provider]  carvedilol (COREG) 6.25 MG tablet Take 1 tablet (6.25 mg total) by mouth 2 (two) times daily with a meal. 02/05/20   [name], MD  Cholecalciferol 25 MCG (1000 UT) tablet Take 1,000 Units by mouth daily.    [provider]  Co-Enzyme Q-10 30 MG CAPS Take 30 mg by mouth daily.    [provider]  Lactobacillus (PROBIOTIC ACIDOPHILUS PO) Take 1 tablet by mouth daily.    [provider]  lisinopril (ZESTRIL) 2.5 MG tablet Take 1 tablet (2.5 mg total) by mouth daily. 02/05/20   [name], MD  Magnesium Gluconate 550 MG TABS Take 550 mg by mouth daily.    [provider]  Omega-3 Fatty Acids (RA FISH OIL) 1000 MG CAPS Take 1,000 mg by mouth daily.    [provider]  zinc gluconate 50 MG tablet Take 50 mg by mouth 3 (three) times a week.    [provider]    Allergies    Metoprolol tartrate  Review of Systems   Review of Systems  Constitutional: Positive for activity change, appetite change and fatigue. Negative for chills and fever.  HENT: Negative for ear pain and sore throat.   Eyes: Negative for pain and visual disturbance.  Respiratory: Negative for cough and shortness of breath.   Cardiovascular: Negative for chest pain and palpitations.  Gastrointestinal: Negative for abdominal distention, abdominal pain, anal bleeding, blood in stool, constipation and vomiting.  Genitourinary: Negative for dysuria and hematuria.  Musculoskeletal: Negative for arthralgias and back pain.  Skin: Negative for color change and rash.  Neurological:  Positive for weakness. Negative for seizures and syncope.  Psychiatric/Behavioral: Positive for sleep disturbance. Negative for suicidal ideas. The patient is not nervous/anxious.   All other systems reviewed and are negative.   Physical Exam Updated Vital Signs  ED Triage Vitals  Enc Vitals Group     BP 03/13/20 1010 (!) 133/96     Pulse Rate 03/13/20 1010 (!) 112     Resp 03/13/20 1010 18     Temp 03/13/20 1010 99.3 F (37.4 C)     Temp Source 03/13/20 1010 Oral     SpO2 03/13/20 1010 96 %     Weight --      Height --      Head Circumference --      Peak Flow --      Pain Score 03/13/20 1014 3     Pain Loc --      Pain Edu? --      Excl. in [place]? --     Physical Exam Vitals and nursing note reviewed.  Constitutional:      General: He is not in acute distress.    Appearance: He is well-developed and well-nourished. He is not ill-appearing.  HENT:     Head: Normocephalic and atraumatic.     Nose: Nose normal.     Mouth/Throat:     Mouth: Mucous membranes are moist.  Eyes:     Extraocular Movements: Extraocular movements intact.     Conjunctiva/sclera: Conjunctivae normal.     Pupils: Pupils are equal, round, and reactive to light.  Cardiovascular:     Rate and Rhythm: Normal rate and regular rhythm.     Pulses: Normal pulses.     Heart sounds: Normal heart sounds. No murmur heard.   Pulmonary:     Effort: Pulmonary effort is normal. No respiratory distress.     Breath sounds: Normal breath sounds.  Abdominal:     Palpations: Abdomen is soft.     Tenderness: There is no abdominal tenderness.  Musculoskeletal:        General: No edema.     Cervical back: Normal range of motion and neck supple.  Skin:    General: Skin is warm and dry.  Neurological:     General: No focal deficit present.     Mental Status: He is alert and oriented to person, place, and time.     Cranial Nerves: No cranial nerve deficit.     Sensory: No sensory deficit.     Motor: No weakness.      Gait: Gait normal.     Comments: 5+ out of 5 strength throughout, normal sensation, no drift, normal finger-nose-finger, normal speech  Psychiatric:        Mood and Affect: Mood and affect and mood normal.        Behavior: Behavior normal.  Thought Content: Thought content normal.        Judgment: Judgment normal.     ED Results / Procedures / Treatments   Labs (all labs ordered are listed, but only abnormal results are displayed) Labs Reviewed  RESP PANEL BY RT-PCR (FLU A&B, COVID) ARPGX2 - Abnormal; Notable for the following components:      Result Value   SARS Coronavirus 2 by RT PCR POSITIVE (*)    All other components within normal limits  BASIC METABOLIC PANEL - Abnormal; Notable for the following components:   Sodium 134 (*)    Calcium 8.6 (*)    All other components within normal limits  CBC - Abnormal; Notable for the following components:   Platelets 107 (*)    All other components within normal limits  URINALYSIS, ROUTINE W REFLEX MICROSCOPIC - Abnormal; Notable for the following components:   Ketones, ur 20 (*)    All other components within normal limits  HEPATIC FUNCTION PANEL - Abnormal; Notable for the following components:   Total Protein 6.0 (*)    Albumin 3.0 (*)    Total Bilirubin 1.7 (*)    Bilirubin, Direct 0.4 (*)    Indirect Bilirubin 1.3 (*)    All other components within normal limits  TROPONIN I (HIGH SENSITIVITY) - Abnormal; Notable for the following components:   Troponin I (High Sensitivity) 149 (*)    All other components within normal limits  TROPONIN I (HIGH SENSITIVITY) - Abnormal; Notable for the following components:   Troponin I (High Sensitivity) 151 (*)    All other components within normal limits  LIPASE, BLOOD  CBG MONITORING, ED    EKG EKG Interpretation  Date/Time:  Sunday March 13 2020 10:15:08 EST Ventricular Rate:  110 PR Interval:    QRS Duration: 84 QT Interval:  294 QTC Calculation: 397 R  Axis:   23 Text Interpretation: Atrial fibrillation with rapid ventricular response with premature ventricular or aberrantly conducted complexes Anteroseptal infarct , age undetermined Abnormal ECG Confirmed by Lennice Sites [phone]) on 03/13/2020 2:53:41 PM   Radiology CT Head Wo Contrast  Result Date: 03/13/2020 CLINICAL DATA:  Altered mental status. EXAM: CT HEAD WITHOUT CONTRAST TECHNIQUE: Contiguous axial images were obtained from the base of the skull through the vertex without intravenous contrast. COMPARISON:  None. FINDINGS: Brain: There is mild to moderate severity cerebral atrophy with widening of the extra-axial spaces and ventricular dilatation. There are areas of decreased attenuation within the white matter tracts of the supratentorial brain, consistent with microvascular disease changes. Vascular: No hyperdense vessel or unexpected calcification. Skull: Normal. Negative for fracture or focal lesion. Sinuses/Orbits: There is mild bilateral ethmoid sinus mucosal thickening. Other: None. IMPRESSION: 1. Generalized cerebral atrophy. 2. No acute intracranial abnormality. Electronically Signed   By: Virgina Norfolk M.D.   On: 03/13/2020 15:45   DG Chest Portable 1 View  Result Date: 03/13/2020 CLINICAL DATA:  Weakness x2 weeks. EXAM: PORTABLE CHEST 1 VIEW COMPARISON:  September 21, 2017 FINDINGS: Mild, chronic appearing increased lung markings are seen. There is no evidence of acute infiltrate, pleural effusion or pneumothorax. The cardiac silhouette is moderately enlarged and unchanged in size. There is tortuosity of the descending thoracic aorta. Degenerative changes seen involving the right shoulder. IMPRESSION: Stable cardiomegaly without acute or active cardiopulmonary disease. Electronically Signed   By: Virgina Norfolk M.D.   On: 03/13/2020 15:11    Procedures Procedures (including critical care time)  Medications Ordered in ED Medications  sodium chloride 0.9 %  bolus 1,000 mL (0  mLs Intravenous Stopped 03/13/20 1755)  carvedilol (COREG) tablet 6.25 mg (6.25 mg Oral Given 03/13/20 1826)    ED Course  I have reviewed the triage vital signs and the nursing notes.  Pertinent labs & imaging results that were available during my care of the patient were reviewed by me and considered in my medical decision making (see chart for details).    MDM Rules/Calculators/A&P                          Austin Wong is an 80 year old male with history of hypertension, chronic kidney disease, atrial fibrillation who presents to the ED with generalized weakness of the past 2 weeks.  Unremarkable vitals.  No fever.  Normal neurological exam.  Mostly just states generalized weakness with decreased appetite.  Overall he appears well.  Basic labs were ordered prior to my evaluation the patient has no significant anemia, electrolyte abnormality, kidney injury.  Will expand work-up to include infectious process such as possibly a UTI, pneumonia.  We will get a head CT.  We will check troponin as well.    Head CT normal.  Chest x-ray without any signs of infection.  However COVID test is positive.  Suspect that this is the source of his generalized weakness recently.  Troponin is 149 but after talking on the phone with cardiology this appears to be around his baseline.  We will check a second troponin but patient not having any chest pain.  He is intermittently having atrial fibrillation with slightly faster rate in the 100s.  At rest he is in the 60s.  He has not taken his Coreg today we will give him his dose.  He got a fluid bolus and is feeling better.  Overall suspect symptoms are secondary to COVID, poor nutrition.  He has no hypoxia at rest or with ambulation. Repeat troponin is stable. Recommend calling COVID clinic to see if he would be a candidate for antibody infusion but sounds like he has been symptomatic for more than 2 weeks and likely not a candidate.  He understands return precautions and  discharged in the ED in good condition.  This chart was dictated using voice recognition software.  Despite best efforts to proofread,  errors can occur which can change the documentation meaning.   Austin Wong was evaluated in Emergency Department on 03/13/2020 for the symptoms described in the history of present illness. He was evaluated in the context of the global COVID-19 pandemic, which necessitated consideration that the patient might be at risk for infection with the SARS-CoV-2 virus that causes COVID-19. Institutional protocols and algorithms that pertain to the evaluation of patients at risk for COVID-19 are in a state of rapid change based on information released by regulatory bodies including the CDC and federal and state organizations. These policies and algorithms were followed during the patient's care in the ED.   Final Clinical Impression(s) / ED Diagnoses Final diagnoses:  COVID-19  Weakness    Rx / DC Orders ED Discharge Orders    None       Lennice Sites, DO 03/13/20 [place], [place], DO 03/13/20 1855

## 2020-03-13 NOTE — ED Notes (Signed)
Pt given urinal and asked to provide a urine sample.  

## 2020-03-13 NOTE — ED Notes (Signed)
Discharge instructions discussed with pt. Pt verbalized understanding. Pt stable and ambulatory. No signature pad available. 

## 2020-03-17 ENCOUNTER — Telehealth: Payer: Self-pay | Admitting: Cardiology

## 2020-03-17 NOTE — Telephone Encounter (Signed)
Patient assistance application have been received. Nurse attempted to contact pt to explain he will need to complete patient's portion. Left message to call back.

## 2020-03-17 NOTE — Telephone Encounter (Signed)
New messagea:       Patient daughter calling to report that her father has covid and would like for a nurse to call. Patient daughter name Cecille Rubin

## 2020-03-17 NOTE — Telephone Encounter (Signed)
Spoke to patient's daughter.Stated she is worried about father.Stated he went to ED this past Saturday and tested positive for covid.Stated he has been in bed since Monday.He feels awful,weak.He is not eating or drinking.He is not taking his medications.She called his PCP and was not advised much on what to do.Advised he needs to stay hydrated.She wanted Dr.Christopher's advice.Message sent to Dr.Christopher.

## 2020-03-18 NOTE — Telephone Encounter (Signed)
I am sorry to hear this! Is his breathing/oxygen numbers ok? If he has been in bed for several days, I agree with needing to make sure that he is at least drinking well and hopefully back to eating. He would be considered high risk with Covid, so if he has a positive test I can try to refer him to get monoclonal antibodies through the infusion center. Is this something he would be willing to do? Please ask his family to keep [place] updated. I am not too worried about his medications other than the apixaban (and statistically a short time off of it is unlikely to cause problems), and he can restart taking the lisinopril and carvedilol once he is feeling better.

## 2020-03-18 NOTE — Telephone Encounter (Signed)
Daughter updated and state she would reach out to pt to see if he is willing to try monoclonal infusion. Daughter will call back to let us know.

## 2020-03-28 ENCOUNTER — Telehealth: Payer: Self-pay | Admitting: Licensed Clinical Social Worker

## 2020-03-28 NOTE — Telephone Encounter (Signed)
LCSW contacted by Lars Mage regarding  assistance with PAP for Eliquis. Pt daughter per notes is involved w/ pt care. Lars Mage will contact her first to see if she can assist and if not then CSW remains available. Pt will need to submit copays from pharmacy, CSW called and requested these from pharmacy [facility]. These are to be securely faxed to [facility]. Alisha aware.   Westley Hummer, MSW, [place]  [phone]

## 2020-03-28 NOTE — Telephone Encounter (Signed)
Nurse called to check up on pt. He voiced that he is now feel well and will call our office if he have any concerns.

## 2020-04-12 NOTE — Telephone Encounter (Signed)
Spoke with pt and explained patient assistance application and everything that needs to be completed for his part. Pt also informed to return application once he's done. Pt verbalized understanding.

## 2020-04-22 ENCOUNTER — Ambulatory Visit (INDEPENDENT_AMBULATORY_CARE_PROVIDER_SITE_OTHER): Payer: PPO | Admitting: Orthopedic Surgery

## 2020-04-22 ENCOUNTER — Other Ambulatory Visit: Payer: Self-pay

## 2020-04-22 ENCOUNTER — Encounter: Payer: Self-pay | Admitting: Orthopedic Surgery

## 2020-04-22 VITALS — BP 160/100 | HR 90 | Temp 97.5°F | Resp 20 | Ht 70.5 in | Wt 148.8 lb

## 2020-04-22 DIAGNOSIS — Z8616 Personal history of COVID-19: Secondary | ICD-10-CM

## 2020-04-22 DIAGNOSIS — N182 Chronic kidney disease, stage 2 (mild): Secondary | ICD-10-CM | POA: Diagnosis not present

## 2020-04-22 DIAGNOSIS — I5041 Acute combined systolic (congestive) and diastolic (congestive) heart failure: Secondary | ICD-10-CM | POA: Diagnosis not present

## 2020-04-22 DIAGNOSIS — R413 Other amnesia: Secondary | ICD-10-CM

## 2020-04-22 DIAGNOSIS — Z1329 Encounter for screening for other suspected endocrine disorder: Secondary | ICD-10-CM | POA: Diagnosis not present

## 2020-04-22 DIAGNOSIS — R350 Frequency of micturition: Secondary | ICD-10-CM | POA: Diagnosis not present

## 2020-04-22 DIAGNOSIS — I10 Essential (primary) hypertension: Secondary | ICD-10-CM

## 2020-04-22 DIAGNOSIS — M159 Polyosteoarthritis, unspecified: Secondary | ICD-10-CM

## 2020-04-22 DIAGNOSIS — I4821 Permanent atrial fibrillation: Secondary | ICD-10-CM | POA: Diagnosis not present

## 2020-04-22 NOTE — Progress Notes (Signed)
Careteam: Patient Care Team: Christain Sacramento, MD as PCP - General (Family Medicine) [name], MD as PCP - Cardiology (Cardiology)  Seen by: Windell Moulding, AGNP-C  PLACE OF SERVICE:  Indust[employer] Directive information    Allergies  Allergen Reactions  . Metoprolol Tartrate Nausea Only    No chief complaint on file.    HPI: Patient is a 80 y.o. male seen today to establish at [facility]. Medical records from previous provider requested.   Daughter present for encounter.   Previous provider Dr. Kathryne Eriksson. They were unable to get a appointment a few times. Decided to switch due to unavailability. Originally from [place], has been here since 1987. Retired at age 32. 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 Wife passed on 2018/12/28. Daughter main support system. Lives in a single family home- one level. No stairs. Still drives. Prepares own chores. Does all chores.   Covid-19- tested positive at [facility] ED 01/16. Discharged same day. 4 days later, daughter concerned he was not eating or drinking. He was also resting in bed more. She tried to contact his PCP without success. She reached out to Dr. Harrell Gave and monoclonial antibodies ordered. Since he began feeling better, he declined antibody treatment. Does not plan to be vaccinated in the future.    Atrial fibrillation- diagnosed in 2019. Cardiologist Dr. [name]. Takes eliquis, trying to get requalified for medication coverage. Denies hematuria or blood in stool.   Combined heart failure- diagnosed in 2019. Echo in 2019- EF 30-35%. Follows a low sodium diet- does not add salt. Does not weigh self daily. Denies shortness of breath or ankle edema.   Hypertension- he states he has white coat syndrome. Blood pressure elevated today. He brought his own readings today. He checks bp every day. Takes cinnamon, coreg and lisinopril daily. Denies chest  pain, blurred vision and headaches.   Recent home blood pressure readings are as follows:  02/24- 122/82  02/23- 112/77  02/22- 101/73  02/21- 117/78  Chronic Kidney Disease stage 3- has been told this in the past. Avoids NSAIDS. Tries to drink few glasses of water daily.   Arthiritis- bilateral shoulders. Bought a "flex" cream for pain. Limited ROM. Does not want to see specialist. No other orthopedic issues.   Urinary frequency- has had this for a few years. Takes Demanos to help prevent infections. Goes about 4 times a night. Drinks water or listens to water to clear bladder. 4 trips to bathroom every night.   No recent falls or injuries.   Past surgeries- Tonsils removed at childhood. Hernia repair years ago, does not remember age.   Diet- eating about 3 meals a day and snacks in between. Low sodium diet.   Alcohol use- use to drink one beer. Stopped drinking, claims his blood pressure goes down.   Does not smoke. When he was young he had  occassional cigarette or cigar. Never a daily smoker.   Never used illicit drugs.   Eye exam- wears glasses. Last eye exam over 2 years ago.   Dental exam-has not seen dentist since getting dentures. Dentures placed years ago.   Colonoscopy- Had a cologuard about 5 years ago. Reports test was negative.   Immunizations- unknown. Will wait for records from PCP.  Memory- feel forgetful at times. Reports he will go into shed to get something and forget what he was trying to get.  Never had memory test done.   Advanced care planning- daughter brought copy of will today.     Review of Systems:  Review of Systems  Constitutional: Positive for malaise/fatigue. Negative for fever and weight loss.  HENT: Positive for hearing loss. Negative for sore throat.        Right hearing aid. Upper and lower dentures  Eyes: Negative for blurred vision and photophobia.       Glasses  Respiratory: Negative for cough, shortness of breath and wheezing.    Cardiovascular: Negative for chest pain, orthopnea and leg swelling.  Gastrointestinal: Negative for abdominal pain, blood in stool, constipation, heartburn, nausea and vomiting.  Genitourinary: Positive for frequency. Negative for dysuria and hematuria.  Musculoskeletal: Positive for joint pain. Negative for falls.       Bilateral shoulder pain  Neurological: Negative for dizziness, weakness and headaches.  Endo/Heme/Allergies: Bruises/bleeds easily.  Psychiatric/Behavioral: Positive for memory loss. Negative for depression. The patient is not nervous/anxious and does not have insomnia.     Past Medical History:  Diagnosis Date  . Arthritis   . Atrial fibrillation ([place])   . Chronic kidney disease    Creatinine 1.2 in 2012  . Hypertension    Diet controlled in the past   Past Surgical History:  Procedure Laterality Date  . INGUINAL HERNIA REPAIR     bilateral  . RIGHT/LEFT HEART CATH AND CORONARY ANGIOGRAPHY N/A 09/24/2017   Procedure: RIGHT/LEFT HEART CATH AND CORONARY ANGIOGRAPHY;  Surgeon: Leonie Man, MD;  Location: [facility];  Service: Cardiovascular;  Laterality: N/A;  . TONSILLECTOMY     Social History:   reports that he has never smoked. He has never used smokeless tobacco. He reports current alcohol use of about 1.0 standard drink of alcohol per week. He reports that he does not use drugs.  Family History  Problem Relation Age of Onset  . CAD Father   . Hyperlipidemia Father   . Heart attack Father 68    Medications: Patient's Medications  New Prescriptions   No medications on file  Previous Medications   APIXABAN (ELIQUIS) 5 MG TABS TABLET    Take 1 tablet (5 mg total) by mouth 2 (two) times daily.   B COMPLEX VITAMINS (VITAMIN B COMPLEX PO)    Take 1 tablet by mouth daily.   CALCIUM-VITAMIN D-VITAMIN K PO    Take 2 tablets by mouth 2 (two) times daily.   CARVEDILOL (COREG) 6.25 MG TABLET    Take 1 tablet (6.25 mg total) by mouth 2 (two) times  daily with a meal.   CHOLECALCIFEROL (HM VITAMIN D3) 100 MCG (4000 UT) CAPS    Take 1 capsule by mouth daily in the afternoon.   COENZYME Q10 (COQ10) 100 MG CAPS    Take 1 tablet by mouth every other day.   LACTOBACILLUS (PROBIOTIC ACIDOPHILUS PO)    Take 1 tablet by mouth daily.   LISINOPRIL (ZESTRIL) 2.5 MG TABLET    Take 1 tablet (2.5 mg total) by mouth daily.   OMEGA-3 FATTY ACIDS (FISH OIL CONCENTRATE PO)    Take 1,600 mg by mouth daily in the afternoon.   VITAMIN C (ASCORBIC ACID) 500 MG TABLET    Take 500 mg by mouth 3 (three) times daily.  Modified Medications   No medications on file  Discontinued Medications   No medications on file    Physical Exam:  There were no vitals filed for this visit. There is no height or weight  on file to calculate BMI. Wt Readings from Last 3 Encounters:  02/05/20 160 lb 6.4 oz (72.8 kg)  07/30/19 160 lb 6.4 oz (72.8 kg)  02/17/19 161 lb 6.4 oz (73.2 kg)    Physical Exam Vitals reviewed.  Constitutional:      General: He is not in acute distress. HENT:     Head: Normocephalic.     Right Ear: There is no impacted cerumen.     Left Ear: There is no impacted cerumen.     Nose: Nose normal.     Mouth/Throat:     Mouth: Mucous membranes are moist.  Eyes:     General:        Right eye: No discharge.        Left eye: No discharge.  Cardiovascular:     Rate and Rhythm: Normal rate. Rhythm irregular.     Pulses: Normal pulses.     Heart sounds: Normal heart sounds. No murmur heard.   Pulmonary:     Effort: Pulmonary effort is normal. No respiratory distress.     Breath sounds: Normal breath sounds. No wheezing.  Abdominal:     General: Abdomen is flat. Bowel sounds are normal. There is no distension.     Palpations: Abdomen is soft.     Tenderness: There is no abdominal tenderness.  Musculoskeletal:     Cervical back: Normal range of motion.     Right lower leg: No edema.     Left lower leg: No edema.     Comments: Positive drop arm  right shoulder. Limited ROM left shoulder.   Lymphadenopathy:     Cervical: No cervical adenopathy.  Skin:    General: Skin is warm and dry.     Capillary Refill: Capillary refill takes less than 2 seconds.  Neurological:     General: No focal deficit present.     Mental Status: He is alert and oriented to person, place, and time.     Motor: Weakness present.     Gait: Gait abnormal.  Psychiatric:        Mood and Affect: Mood normal.        Behavior: Behavior normal.     Labs reviewed: Basic Metabolic Panel: Recent Labs    02/05/20 1212 03/13/20 1026  NA 141 134*  K 4.7 4.3  CL 102 98  CO2 27 25  GLUCOSE 104* 88  BUN 16 18  CREATININE 0.99 1.12  CALCIUM 9.8 8.6*   Liver Function Tests: Recent Labs    03/13/20 1509  AST 41  ALT 21  ALKPHOS 53  BILITOT 1.7*  PROT 6.0*  ALBUMIN 3.0*   Recent Labs    03/13/20 1509  LIPASE 24   No results for input(s): AMMONIA in the last 8760 hours. CBC: Recent Labs    02/05/20 1212 03/13/20 1026  WBC 5.8 4.2  HGB 14.6 14.2  HCT 42.4 40.8  MCV 92 90.7  PLT 147* 107*   Lipid Panel: Recent Labs    02/05/20 1212  CHOL 176  HDL 48  LDLCALC 117*  TRIG 59  CHOLHDL 3.7   TSH: No results for input(s): TSH in the last 8760 hours. A1C: No results found for: HGBA1C   Assessment/Plan 1. Permanent atrial fibrillation - rate controlled with metoprolol - cont eliquis for clot prevention  2. Essential hypertension - elevated in office due to white coat syndrome - home bp reading at goal -cont diet limiting sodium < 2000 mg /day - cont  lisinopril and metoprolol - cbc/diff  3. Stage 2 chronic kidney disease - Last GFR >60 - continue to avoid nephrotoxic drugs like NSAIDS and does adjust medications to be renally excreted - enocurage hydration - cmp- future  4. 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 Acute combined systolic and diastolic heart failure ([place]) - denies weight fluctuations, shortness of breath or ankle edema - advised to weight self daily - cont to avoid sodium in diet  6. History of COVID-19 - diagnosed in 02/2020 - did not receive monoclonial antibody treatment - no long term symptoms at this time - recommend covid vaccination in 3 months- he is not interested in vaccination at this time  7. Urinary frequency - has been told he has BPH, no medications  8. Memory changes - appropriate during exam - MMSE- future  9. Screening for thyroid disorder - TSH- future  I provided 55 minutes of face-to-face time during this encounter.    Next appt: 05/20/2020 Windell Moulding, AGNP  Future labs/tests: cbc/diff, cmp, TSH, MMSE  [facility] [phone]

## 2020-04-22 NOTE — Patient Instructions (Signed)

## 2020-04-25 DIAGNOSIS — R350 Frequency of micturition: Secondary | ICD-10-CM | POA: Insufficient documentation

## 2020-04-25 DIAGNOSIS — M159 Polyosteoarthritis, unspecified: Secondary | ICD-10-CM | POA: Insufficient documentation

## 2020-04-25 DIAGNOSIS — R413 Other amnesia: Secondary | ICD-10-CM | POA: Insufficient documentation

## 2020-04-26 ENCOUNTER — Telehealth: Payer: Self-pay

## 2020-04-26 NOTE — Telephone Encounter (Signed)
Opened in error

## 2020-04-26 NOTE — Telephone Encounter (Signed)
Contact pt regarding patient assistance application. Pt state he has already mailed his portion of application to [employer].

## 2020-05-03 NOTE — Telephone Encounter (Signed)
Called patient assistance to check on status of application. Was informed they received patient part but pt forget to date and sign. Also, patient sent in 3% out of pocket prescription expenses for 2021 instead of 2022.  Pt made aware and voiced he will gather correct documentation, sign forms, then bring to office for nurse to fax.

## 2020-05-11 ENCOUNTER — Telehealth: Payer: Self-pay | Admitting: Licensed Clinical Social Worker

## 2020-05-11 NOTE — Telephone Encounter (Signed)
LCSW assisted Lars Mage, RN, w/ obtaining copay receipt for pt from [employer] in [place]. Unfortunately pt does not currently have any incurred copay costs for the year. However, Lars Mage will fax in PAP application to [place] for evaluation for any assistance that pt may be eligible for.   I have also called pt to update him on the above. HIPAA compliant message left for pt.  Remain available for any additional assistance needed.    Westley Hummer, MSW, Trainer  [phone]

## 2020-05-17 NOTE — Telephone Encounter (Signed)
Patient assistance application faxed to [facility] on 05/13/20.  Awaiting approval

## 2020-05-17 NOTE — Telephone Encounter (Signed)
Received a fax from [employer] stating patient assistance for Eliquis has been approved through 02/25/21  Patient notified.

## 2020-05-20 ENCOUNTER — Encounter: Payer: Self-pay | Admitting: Orthopedic Surgery

## 2020-05-20 ENCOUNTER — Other Ambulatory Visit: Payer: Self-pay

## 2020-05-20 ENCOUNTER — Ambulatory Visit (INDEPENDENT_AMBULATORY_CARE_PROVIDER_SITE_OTHER): Payer: PPO | Admitting: Orthopedic Surgery

## 2020-05-20 VITALS — BP 140/90 | HR 74 | Temp 97.5°F | Resp 20 | Ht 71.0 in | Wt 153.2 lb

## 2020-05-20 DIAGNOSIS — R350 Frequency of micturition: Secondary | ICD-10-CM

## 2020-05-20 DIAGNOSIS — I10 Essential (primary) hypertension: Secondary | ICD-10-CM | POA: Diagnosis not present

## 2020-05-20 DIAGNOSIS — Z1329 Encounter for screening for other suspected endocrine disorder: Secondary | ICD-10-CM | POA: Diagnosis not present

## 2020-05-20 DIAGNOSIS — Z8616 Personal history of COVID-19: Secondary | ICD-10-CM

## 2020-05-20 DIAGNOSIS — N182 Chronic kidney disease, stage 2 (mild): Secondary | ICD-10-CM | POA: Diagnosis not present

## 2020-05-20 DIAGNOSIS — Z23 Encounter for immunization: Secondary | ICD-10-CM | POA: Diagnosis not present

## 2020-05-20 DIAGNOSIS — R200 Anesthesia of skin: Secondary | ICD-10-CM

## 2020-05-20 DIAGNOSIS — R413 Other amnesia: Secondary | ICD-10-CM | POA: Diagnosis not present

## 2020-05-20 DIAGNOSIS — I4821 Permanent atrial fibrillation: Secondary | ICD-10-CM | POA: Diagnosis not present

## 2020-05-20 DIAGNOSIS — I428 Other cardiomyopathies: Secondary | ICD-10-CM

## 2020-05-20 DIAGNOSIS — M159 Polyosteoarthritis, unspecified: Secondary | ICD-10-CM | POA: Diagnosis not present

## 2020-05-20 DIAGNOSIS — I5022 Chronic systolic (congestive) heart failure: Secondary | ICD-10-CM | POA: Diagnosis not present

## 2020-05-20 MED ORDER — CARVEDILOL 6.25 MG PO TABS: 6.2500 mg | ORAL_TABLET | Freq: Two times a day (BID) | ORAL | 1 refills | Status: DC

## 2020-05-20 MED ORDER — LISINOPRIL 2.5 MG PO TABS: 2.5000 mg | ORAL_TABLET | Freq: Every day | ORAL | 1 refills | Status: DC

## 2020-05-20 NOTE — Progress Notes (Signed)
Careteam: Patient Care Team: [name], NP as PCP - General (Adult Health Nurse Practitioner) [name], MD as PCP - Cardiology (Cardiology)  Seen by: Windell Moulding, AGNP-C  PLACE OF SERVICE:  [place] Directive information Does Patient Have a Medical Advance Directive?: Yes, Type of Advance Directive: [place];Living will;Out of facility DNR (pink MOST or yellow form), Does patient want to make changes to medical advance directive?: No - Patient declined  Allergies  Allergen Reactions  . Metoprolol Tartrate Nausea Only    Chief Complaint  Patient presents with  . Medical Management of Chronic Issues    4 Week Follow Up     HPI: Patient is a 80 y.o. male seen today for follow up of medical management of chronic conditions.   Daughter present for encounter.   Requesting refill for carvedilol and lisinopril.   He has a history of elevated blood pressure in office. He checks his blood pressure reading from last 4 days. He also did our last appointment.Unclear when he if he is taking his blood pressure before of after taking prescribed medications. Readings today are low. He claims his pressures vary if he takes cinnamon supplement, but have been averaging low. He states when his blood pressure is low he does not have enough enerygy. He denies dizziness or lightheadedness. No recent falls or injuries.   Recent blood pressures are as follows:  03/21- 102/69  03/22- 90/64  03/23- 116/79  03/24- 94/68  He complains of his hands "going to sleep" at night.  Mainly in tips of fingers feel numb. He has limited ROM due to chronic arthritis in his shoulders. Denies burning sensation. Denies recent trauma or injury to upper extremities.   MMSE today 30/30.       Review of Systems:  Review of Systems  Constitutional: Negative for fever and malaise/fatigue.  HENT: Negative for hearing loss and sore throat.   Eyes: Negative for  blurred vision.  Respiratory: Negative for cough, shortness of breath and wheezing.   Cardiovascular: Negative for chest pain and leg swelling.  Gastrointestinal: Negative for constipation, heartburn, nausea and vomiting.  Genitourinary: Negative for dysuria and hematuria.  Musculoskeletal: Positive for joint pain and myalgias.       Bilateral shoulder pain  Neurological: Negative for dizziness, weakness and headaches.  Psychiatric/Behavioral: Negative for depression and memory loss. The patient is not nervous/anxious.     Past Medical History:  Diagnosis Date  . Arthritis   . Atrial fibrillation ([place])   . History of high blood pressure    Past Surgical History:  Procedure Laterality Date  . INGUINAL HERNIA REPAIR     bilateral  . RIGHT/LEFT HEART CATH AND CORONARY ANGIOGRAPHY N/A 09/24/2017   Procedure: RIGHT/LEFT HEART CATH AND CORONARY ANGIOGRAPHY;  Surgeon: Leonie Man, MD;  Location: [facility];  Service: Cardiovascular;  Laterality: N/A;  . TONSILLECTOMY     Social History:   reports that he has never smoked. He has never used smokeless tobacco. He reports current alcohol use of about 1.0 standard drink of alcohol per week. He reports that he does not use drugs.  Family History  Problem Relation Age of Onset  . CAD Father   . Hyperlipidemia Father   . Heart attack Father 62    Medications: Patient's Medications  New Prescriptions   No medications on file  Previous Medications   APIXABAN (ELIQUIS) 5 MG TABS TABLET    Take 1  tablet (5 mg total) by mouth 2 (two) times daily.   B COMPLEX VITAMINS (VITAMIN B COMPLEX PO)    Take 1 tablet by mouth daily.   CARVEDILOL (COREG) 6.25 MG TABLET    Take 1 tablet (6.25 mg total) by mouth 2 (two) times daily with a meal.   CHOLECALCIFEROL (VITAMIN D3) 125 MCG/ML LIQD    Place 4 drops under the tongue daily.   COENZYME Q10 (COQ10) 100 MG CAPS    Take 1 tablet by mouth every other day.   LACTOBACILLUS (PROBIOTIC  ACIDOPHILUS PO)    Take 1 tablet by mouth daily.   LISINOPRIL (ZESTRIL) 2.5 MG TABLET    Take 1 tablet (2.5 mg total) by mouth daily.   MILK THISTLE PO    Take 1 capsule by mouth daily in the afternoon.   NON FORMULARY    Take 2 tablets by mouth 2 (two) times daily. Bone Up  with K2 (MK-7) Calcium   OMEGA-3 FATTY ACIDS (FISH OIL CONCENTRATE PO)    Take 1,600 mg by mouth daily in the afternoon.   VITAMIN C (ASCORBIC ACID) 500 MG TABLET    Take 500 mg by mouth 2 (two) times daily.  Modified Medications   No medications on file  Discontinued Medications   No medications on file    Physical Exam:  Vitals:   05/20/20 1054  BP: 140/90  Pulse: 74  Resp: 20  Temp: (!) 97.5 F (36.4 C)  TempSrc: Temporal  SpO2: 100%  Weight: 153 lb 3.2 oz (69.5 kg)  Height: 5\' 11"  (1.803 m)   Body mass index is 21.37 kg/m. Wt Readings from Last 3 Encounters:  05/20/20 153 lb 3.2 oz (69.5 kg)  04/22/20 148 lb 12.8 oz (67.5 kg)  02/05/20 160 lb 6.4 oz (72.8 kg)    Physical Exam Vitals reviewed.  HENT:     Head: Normocephalic.     Right Ear: There is no impacted cerumen.     Left Ear: There is no impacted cerumen.     Ears:     Comments: Right hearing aid    Mouth/Throat:     Mouth: Mucous membranes are moist.     Pharynx: No posterior oropharyngeal erythema.  Eyes:     General:        Right eye: No discharge.        Left eye: No discharge.  Neck:     Thyroid: No thyroid mass or thyromegaly.  Cardiovascular:     Rate and Rhythm: Normal rate. Rhythm irregular.     Pulses: Normal pulses.     Heart sounds: Normal heart sounds. No murmur heard.   Pulmonary:     Effort: Pulmonary effort is normal. No respiratory distress.     Breath sounds: Normal breath sounds. No wheezing.  Abdominal:     General: Bowel sounds are normal. There is no distension.     Palpations: Abdomen is soft.     Tenderness: There is no abdominal tenderness.  Musculoskeletal:     Cervical back: Normal range of  motion.     Right lower leg: No edema.     Left lower leg: No edema.  Lymphadenopathy:     Cervical: No cervical adenopathy.  Skin:    General: Skin is warm and dry.     Capillary Refill: Capillary refill takes less than 2 seconds.     Findings: Lesion present.     Comments: Small abrasion to right anterior ankle, CDI, no drainage.  Neurological:     General: No focal deficit present.     Mental Status: He is alert and oriented to person, place, and time.     Motor: No weakness.     Gait: Gait normal.  Psychiatric:        Mood and Affect: Mood normal.        Behavior: Behavior normal.    Labs reviewed: Basic Metabolic Panel: Recent Labs    02/05/20 1212 03/13/20 1026  NA 141 134*  K 4.7 4.3  CL 102 98  CO2 27 25  GLUCOSE 104* 88  BUN 16 18  CREATININE 0.99 1.12  CALCIUM 9.8 8.6*   Liver Function Tests: Recent Labs    03/13/20 1509  AST 41  ALT 21  ALKPHOS 53  BILITOT 1.7*  PROT 6.0*  ALBUMIN 3.0*   Recent Labs    03/13/20 1509  LIPASE 24   No results for input(s): AMMONIA in the last 8760 hours. CBC: Recent Labs    02/05/20 1212 03/13/20 1026  WBC 5.8 4.2  HGB 14.6 14.2  HCT 42.4 40.8  MCV 92 90.7  PLT 147* 107*   Lipid Panel: Recent Labs    02/05/20 1212  CHOL 176  HDL 48  LDLCALC 117*  TRIG 59  CHOLHDL 3.7   TSH: No results for input(s): TSH in the last 8760 hours. A1C: No results found for: HGBA1C   Assessment/Plan 1. Permanent atrial fibrillation (HCC) - rate controlled with metoprolol - cont eliquis for clot prevention - carvedilol (COREG) 6.25 MG tablet; Take 1 tablet (6.25 mg total) by mouth 2 (two) times daily with a meal.  Dispense: 90 tablet; Refill: 1  2. Chronic systolic heart failure (HCC) - no recent weight fluctuations, sob or ankle edema - cont to limit sodium in diet - carvedilol (COREG) 6.25 MG tablet; Take 1 tablet (6.25 mg total) by mouth 2 (two) times daily with a meal.  Dispense: 90 tablet; Refill: 1 -  lisinopril (ZESTRIL) 2.5 MG tablet; Take 1 tablet (2.5 mg total) by mouth daily.  Dispense: 90 tablet; Refill: 1  3. Hand numbness - unclear if from shoulder issues or possibly anemia - Ferritin - Iron and TIBC  4. Essential hypertension - SBP low with home recordings, asymptomatic - advised to take blood pressure 2 hours after taking morning meds - may reduce metoprolol to 3.125 po bid if SBP remains < 100  - cont lisinopril regimen   5. Stage 2 chronic kidney disease - continue to avoid nephrotoxic medications like NSAIDS and dose adjust medications to be renally excreted - hydrate  6. 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 History of COVID-19 - resolved, no chronic symptoms  8. Urinary frequency - past diagnosis of BPH, remains asymptomatic  9. Memory changes - MMSE 30/30  10. Screening for thyroid disorder - TSH today  11. Need for Tdap vaccination - awaiting medical records from [facility]- [place]  Total time 31 minutes. 50% of total time spent doing patient counseling and coordination of care managing blood pressure and medication.    Next appt: Visit date not found [place] [phone]

## 2020-05-20 NOTE — Patient Instructions (Signed)
Please take blood pressure 2 hours after morning blood pressure medication (carvedilol and lisinopril) - if BP is less than 100/60 for 1 week, please reduce carvedilol to 1/2 tablet in morning and evening  Hypertension, Adult Hypertension is another name for high blood pressure. High blood pressure forces your heart to work harder to pump blood. This can cause problems over time. There are two numbers in a blood pressure reading. There is a top number (systolic) over a bottom number (diastolic). It is best to have a blood pressure that is below 120/80. Healthy choices can help lower your blood pressure, or you may need medicine to help lower it. What are the causes? The cause of this condition is not known. Some conditions may be related to high blood pressure. What increases the risk?  Smoking.  Having type 2 diabetes mellitus, high cholesterol, or both.  Not getting enough exercise or physical activity.  Being overweight.  Having too much fat, sugar, calories, or salt (sodium) in your diet.  Drinking too much alcohol.  Having long-term (chronic) kidney disease.  Having a family history of high blood pressure.  Age. Risk increases with age.  Race. You may be at higher risk if you are African American.  Gender. Men are at higher risk than women before age 106. After age 37, women are at higher risk than men.  Having obstructive sleep apnea.  Stress. What are the signs or symptoms?  High blood pressure may not cause symptoms. Very high blood pressure (hypertensive crisis) may cause: ? Headache. ? Feelings of worry or nervousness (anxiety). ? Shortness of breath. ? Nosebleed. ? A feeling of being sick to your stomach (nausea). ? Throwing up (vomiting). ? Changes in how you see. ? Very bad chest pain. ? Seizures. How is this treated?  This condition is treated by making healthy lifestyle changes, such as: ? Eating healthy foods. ? Exercising more. ? Drinking less  alcohol.  Your health care provider may prescribe medicine if lifestyle changes are not enough to get your blood pressure under control, and if: ? Your top number is above 130. ? Your bottom number is above 80.  Your personal target blood pressure may vary. Follow these instructions at home: Eating and drinking  If told, follow the DASH eating plan. To follow this plan: ? Fill one half of your plate at each meal with fruits and vegetables. ? Fill one fourth of your plate at each meal with whole grains. Whole grains include whole-wheat pasta, brown rice, and whole-grain bread. ? Eat or drink low-fat dairy products, such as skim milk or low-fat yogurt. ? Fill one fourth of your plate at each meal with low-fat (lean) proteins. Low-fat proteins include fish, chicken without skin, eggs, beans, and tofu. ? Avoid fatty meat, cured and processed meat, or chicken with skin. ? Avoid pre-made or processed food.  Eat less than 1,500 mg of salt each day.  Do not drink alcohol if: ? Your doctor tells you not to drink. ? You are pregnant, may be pregnant, or are planning to become pregnant.  If you drink alcohol: ? Limit how much you use to:  0-1 drink a day for women.  0-2 drinks a day for men. ? Be aware of how much alcohol is in your drink. In the [place] one drink equals one 12 oz bottle of beer (355 mL), one 5 oz glass of wine (148 mL), or one 1 oz glass of hard liquor (44 mL).  Lifestyle  Work with your doctor to stay at a healthy weight or to lose weight. Ask your doctor what the best weight is for you.  Get at least 30 minutes of exercise most days of the week. This may include walking, swimming, or biking.  Get at least 30 minutes of exercise that strengthens your muscles (resistance exercise) at least 3 days a week. This may include lifting weights or doing Pilates.  Do not use any products that contain nicotine or tobacco, such as cigarettes, e-cigarettes, and chewing tobacco. If  you need help quitting, ask your doctor.  Check your blood pressure at home as told by your doctor.  Keep all follow-up visits as told by your doctor. This is important.   Medicines  Take over-the-counter and prescription medicines only as told by your doctor. Follow directions carefully.  Do not skip doses of blood pressure medicine. The medicine does not work as well if you skip doses. Skipping doses also puts you at risk for problems.  Ask your doctor about side effects or reactions to medicines that you should watch for. Contact a doctor if you:  Think you are having a reaction to the medicine you are taking.  Have headaches that keep coming back (recurring).  Feel dizzy.  Have swelling in your ankles.  Have trouble with your vision. Get help right away if you:  Get a very bad headache.  Start to feel mixed up (confused).  Feel weak or numb.  Feel faint.  Have very bad pain in your: ? Chest. ? Belly (abdomen).  Throw up more than once.  Have trouble breathing. Summary  Hypertension is another name for high blood pressure.  High blood pressure forces your heart to work harder to pump blood.  For most people, a normal blood pressure is less than 120/80.  Making healthy choices can help lower blood pressure. If your blood pressure does not get lower with healthy choices, you may need to take medicine. This information is not intended to replace advice given to you by your health care provider. Make sure you discuss any questions you have with your health care provider. Document Revised: 10/23/2017 Document Reviewed: 10/23/2017 Elsevier Patient Education  2021 [employer].

## 2020-05-21 LAB — COMPREHENSIVE METABOLIC PANEL
AG Ratio: 1.5 (calc) (ref 1.0–2.5)
ALT: 13 U/L (ref 9–46)
AST: 18 U/L (ref 10–35)
Albumin: 3.8 g/dL (ref 3.6–5.1)
Alkaline phosphatase (APISO): 71 U/L (ref 35–144)
BUN: 15 mg/dL (ref 7–25)
CO2: 30 mmol/L (ref 20–32)
Calcium: 9.8 mg/dL (ref 8.6–10.3)
Chloride: 101 mmol/L (ref 98–110)
Creat: 0.97 mg/dL (ref 0.70–1.11)
Globulin: 2.6 g/dL (calc) (ref 1.9–3.7)
Glucose, Bld: 116 mg/dL — ABNORMAL HIGH (ref 65–99)
Potassium: 4.4 mmol/L (ref 3.5–5.3)
Sodium: 140 mmol/L (ref 135–146)
Total Bilirubin: 1.3 mg/dL — ABNORMAL HIGH (ref 0.2–1.2)
Total Protein: 6.4 g/dL (ref 6.1–8.1)

## 2020-05-21 LAB — CBC WITH DIFFERENTIAL/PLATELET
Absolute Monocytes: 570 cells/uL (ref 200–950)
Basophils Absolute: 70 cells/uL (ref 0–200)
Basophils Relative: 1.4 %
Eosinophils Absolute: 190 cells/uL (ref 15–500)
Eosinophils Relative: 3.8 %
HCT: 41.4 % (ref 38.5–50.0)
Hemoglobin: 14.1 g/dL (ref 13.2–17.1)
Lymphs Abs: 1315 cells/uL (ref 850–3900)
MCH: 31.8 pg (ref 27.0–33.0)
MCHC: 34.1 g/dL (ref 32.0–36.0)
MCV: 93.2 fL (ref 80.0–100.0)
MPV: 11.5 fL (ref 7.5–12.5)
Monocytes Relative: 11.4 %
Neutro Abs: 2855 cells/uL (ref 1500–7800)
Neutrophils Relative %: 57.1 %
Platelets: 163 10*3/uL (ref 140–400)
RBC: 4.44 10*6/uL (ref 4.20–5.80)
RDW: 13.7 % (ref 11.0–15.0)
Total Lymphocyte: 26.3 %
WBC: 5 10*3/uL (ref 3.8–10.8)

## 2020-05-21 LAB — IRON,TIBC AND FERRITIN PANEL
%SAT: 45 % (calc) (ref 20–48)
Ferritin: 174 ng/mL (ref 24–380)
Iron: 111 ug/dL (ref 50–180)
TIBC: 247 mcg/dL (calc) — ABNORMAL LOW (ref 250–425)

## 2020-05-21 LAB — TSH: TSH: 2.46 mIU/L (ref 0.40–4.50)

## 2020-10-11 ENCOUNTER — Other Ambulatory Visit: Payer: Self-pay | Admitting: Orthopedic Surgery

## 2020-10-11 DIAGNOSIS — I10 Essential (primary) hypertension: Secondary | ICD-10-CM

## 2020-11-15 ENCOUNTER — Other Ambulatory Visit: Payer: Self-pay

## 2020-11-15 ENCOUNTER — Other Ambulatory Visit: Payer: PPO

## 2020-11-15 DIAGNOSIS — I10 Essential (primary) hypertension: Secondary | ICD-10-CM

## 2020-11-15 LAB — CBC WITH DIFFERENTIAL/PLATELET
Absolute Monocytes: 390 cells/uL (ref 200–950)
Basophils Absolute: 57 cells/uL (ref 0–200)
Basophils Relative: 1.1 %
Eosinophils Absolute: 208 cells/uL (ref 15–500)
Eosinophils Relative: 4 %
HCT: 42.2 % (ref 38.5–50.0)
Hemoglobin: 13.9 g/dL (ref 13.2–17.1)
Lymphs Abs: 1331 cells/uL (ref 850–3900)
MCH: 31.3 pg (ref 27.0–33.0)
MCHC: 32.9 g/dL (ref 32.0–36.0)
MCV: 95 fL (ref 80.0–100.0)
MPV: 11.2 fL (ref 7.5–12.5)
Monocytes Relative: 7.5 %
Neutro Abs: 3214 cells/uL (ref 1500–7800)
Neutrophils Relative %: 61.8 %
Platelets: 181 10*3/uL (ref 140–400)
RBC: 4.44 10*6/uL (ref 4.20–5.80)
RDW: 13.1 % (ref 11.0–15.0)
Total Lymphocyte: 25.6 %
WBC: 5.2 10*3/uL (ref 3.8–10.8)

## 2020-11-15 LAB — HEPATIC FUNCTION PANEL
AG Ratio: 1.5 (calc) (ref 1.0–2.5)
ALT: 12 U/L (ref 9–46)
AST: 18 U/L (ref 10–35)
Albumin: 3.9 g/dL (ref 3.6–5.1)
Alkaline phosphatase (APISO): 65 U/L (ref 35–144)
Bilirubin, Direct: 0.3 mg/dL — ABNORMAL HIGH (ref 0.0–0.2)
Globulin: 2.6 g/dL (calc) (ref 1.9–3.7)
Indirect Bilirubin: 0.9 mg/dL (calc) (ref 0.2–1.2)
Total Bilirubin: 1.2 mg/dL (ref 0.2–1.2)
Total Protein: 6.5 g/dL (ref 6.1–8.1)

## 2020-11-15 LAB — BASIC METABOLIC PANEL
BUN/Creatinine Ratio: 17 (calc) (ref 6–22)
BUN: 22 mg/dL (ref 7–25)
CO2: 28 mmol/L (ref 20–32)
Calcium: 9.7 mg/dL (ref 8.6–10.3)
Chloride: 102 mmol/L (ref 98–110)
Creat: 1.28 mg/dL — ABNORMAL HIGH (ref 0.70–1.22)
Glucose, Bld: 130 mg/dL — ABNORMAL HIGH (ref 65–99)
Potassium: 4 mmol/L (ref 3.5–5.3)
Sodium: 139 mmol/L (ref 135–146)

## 2020-11-18 ENCOUNTER — Ambulatory Visit: Payer: PPO | Admitting: Orthopedic Surgery

## 2020-12-01 ENCOUNTER — Encounter: Payer: Self-pay | Admitting: Orthopedic Surgery

## 2020-12-01 ENCOUNTER — Other Ambulatory Visit: Payer: Self-pay

## 2020-12-01 ENCOUNTER — Ambulatory Visit (INDEPENDENT_AMBULATORY_CARE_PROVIDER_SITE_OTHER): Payer: PPO | Admitting: Orthopedic Surgery

## 2020-12-01 VITALS — BP 138/90 | HR 83 | Temp 97.7°F | Ht 71.0 in | Wt 146.7 lb

## 2020-12-01 DIAGNOSIS — I428 Other cardiomyopathies: Secondary | ICD-10-CM | POA: Diagnosis not present

## 2020-12-01 DIAGNOSIS — N182 Chronic kidney disease, stage 2 (mild): Secondary | ICD-10-CM

## 2020-12-01 DIAGNOSIS — R351 Nocturia: Secondary | ICD-10-CM

## 2020-12-01 DIAGNOSIS — I10 Essential (primary) hypertension: Secondary | ICD-10-CM | POA: Diagnosis not present

## 2020-12-01 DIAGNOSIS — I5022 Chronic systolic (congestive) heart failure: Secondary | ICD-10-CM | POA: Diagnosis not present

## 2020-12-01 DIAGNOSIS — R634 Abnormal weight loss: Secondary | ICD-10-CM | POA: Diagnosis not present

## 2020-12-01 DIAGNOSIS — Z6820 Body mass index (BMI) 20.0-20.9, adult: Secondary | ICD-10-CM

## 2020-12-01 DIAGNOSIS — Z13220 Encounter for screening for lipoid disorders: Secondary | ICD-10-CM | POA: Diagnosis not present

## 2020-12-01 DIAGNOSIS — I4821 Permanent atrial fibrillation: Secondary | ICD-10-CM

## 2020-12-01 DIAGNOSIS — R748 Abnormal levels of other serum enzymes: Secondary | ICD-10-CM

## 2020-12-01 DIAGNOSIS — M159 Polyosteoarthritis, unspecified: Secondary | ICD-10-CM | POA: Diagnosis not present

## 2020-12-01 NOTE — Progress Notes (Signed)
Careteam: Patient Care Team: [name], NP as PCP - General (Adult Health Nurse Practitioner) [name], MD as PCP - Cardiology (Cardiology)  Seen by: Windell Moulding, AGNP-C  PLACE OF SERVICE:  [place] Direct[facility] information    Allergies  Allergen Reactions   Metoprolol Tartrate Nausea Only    Chief Complaint  Patient presents with   Medical Management of Chronic Issues    Patient presents today for a 6 month follow-up.   Quality Metric Gaps    Zoster, TDAP, Flu, COVID vaccines.     HPI: Patient is a 80 y.o. male seen today for medical management of chronic conditions.   Labs discussed with patient. AST/ALT returned to normal ranges. He has stopped taking tylenol daily. Does not drink alcohol.   Continues to eat organic foods. Does not eat meat daily, averages 1-2 times a month. Prefers grass fed meats. He has lost 7 lbs since last visit. Admits to eating 3 meals a day.   Nocturia- urinates 5-10x a night. Reports he cannot get bladder to empty and feels urge to urinate often. Symptoms less severe during the day. Tried saw palmetto supplement in the past without improvement. Discussed seeing urology, he is not interested at this time. He would like to cut back fluids in evening and see if symptoms improve. Denies fever or hematuria.  Continues to take blood pressure medication.   Remains on carvedilol and eliquis for afib. He has not seen cardiology in one year. Advised to schedule yearly f/u.   Does not want 3rd covid booster.   Does not wish to get tetanus or shingles vaccine.   Review of Systems:  Review of Systems  Constitutional:  Negative for chills, fever, malaise/fatigue and weight loss.  HENT:  Negative for hearing loss and sore throat.   Eyes:  Negative for blurred vision and double vision.  Respiratory:  Negative for cough, shortness of breath and wheezing.   Cardiovascular:  Negative for chest pain, palpitations and leg swelling.   Gastrointestinal:  Negative for abdominal pain, constipation, diarrhea, heartburn, nausea and vomiting.  Genitourinary:  Positive for frequency and urgency. Negative for dysuria and hematuria.  Musculoskeletal:  Positive for joint pain. Negative for falls and myalgias.  Skin: Negative.   Neurological:  Negative for dizziness, tingling, weakness and headaches.  Psychiatric/Behavioral:  Negative for depression. The patient is not nervous/anxious and does not have insomnia.    Past Medical History:  Diagnosis Date   Arthritis    Atrial fibrillation ([place])    History of high blood pressure    Past Surgical History:  Procedure Laterality Date   INGUINAL HERNIA REPAIR     bilateral   RIGHT/LEFT HEART CATH AND CORONARY ANGIOGRAPHY N/A 09/24/2017   Procedure: RIGHT/LEFT HEART CATH AND CORONARY ANGIOGRAPHY;  Surgeon: Leonie Man, MD;  Location: [facility];  Service: Cardiovascular;  Laterality: N/A;   TONSILLECTOMY     Social History:   reports that he has never smoked. He has never used smokeless tobacco. He reports current alcohol use of about 1.0 standard drink per week. He reports that he does not use drugs.  Family History  Problem Relation Age of Onset   CAD Father    Hyperlipidemia Father    Heart attack Father 28    Medications: Patient's Medications  New Prescriptions   No medications on file  Previous Medications   APIXABAN (ELIQUIS) 5 MG TABS TABLET    Take 1 tablet (5  mg total) by mouth 2 (two) times daily.   B COMPLEX VITAMINS (VITAMIN B COMPLEX PO)    Take 1 tablet by mouth daily.   CARVEDILOL (COREG) 6.25 MG TABLET    Take 1 tablet (6.25 mg total) by mouth 2 (two) times daily with a meal.   CHOLECALCIFEROL (VITAMIN D3) 125 MCG/ML LIQD    Place 4 drops under the tongue daily.   COENZYME Q10 (COQ10) 100 MG CAPS    Take 1 tablet by mouth every other day.   LACTOBACILLUS (PROBIOTIC ACIDOPHILUS PO)    Take 1 tablet by mouth daily.   LISINOPRIL (ZESTRIL) 2.5 MG  TABLET    Take 1 tablet (2.5 mg total) by mouth daily.   MILK THISTLE PO    Take 1 capsule by mouth daily in the afternoon.   NON FORMULARY    Take 2 tablets by mouth 2 (two) times daily. Bone Up  with K2 (MK-7) Calcium   OMEGA-3 FATTY ACIDS (FISH OIL CONCENTRATE PO)    Take 1,600 mg by mouth daily in the afternoon.   VITAMIN C (ASCORBIC ACID) 500 MG TABLET    Take 500 mg by mouth 2 (two) times daily.  Modified Medications   No medications on file  Discontinued Medications   No medications on file    Physical Exam:  Vitals:   12/01/20 1121  BP: 138/90  Pulse: 83  Temp: 97.7 F (36.5 C)  SpO2: 97%  Weight: 146 lb 11.2 oz (66.5 kg)  Height: 5\' 11"  (1.803 m)   Body mass index is 20.46 kg/m. Wt Readings from Last 3 Encounters:  12/01/20 146 lb 11.2 oz (66.5 kg)  05/20/20 153 lb 3.2 oz (69.5 kg)  04/22/20 148 lb 12.8 oz (67.5 kg)    Physical Exam Vitals reviewed.  Constitutional:      General: He is not in acute distress. HENT:     Head: Normocephalic.     Right Ear: There is no impacted cerumen.     Left Ear: There is no impacted cerumen.     Nose: Nose normal.     Mouth/Throat:     Mouth: Mucous membranes are moist.  Eyes:     General:        Right eye: No discharge.        Left eye: No discharge.  Neck:     Vascular: No carotid bruit.  Cardiovascular:     Rate and Rhythm: Normal rate. Rhythm irregular.     Pulses: Normal pulses.     Heart sounds: Normal heart sounds. No murmur heard. Pulmonary:     Effort: Pulmonary effort is normal. No respiratory distress.     Breath sounds: Normal breath sounds. No wheezing.  Abdominal:     General: Bowel sounds are normal. There is no distension.     Palpations: Abdomen is soft.     Tenderness: There is no abdominal tenderness.  Musculoskeletal:     Cervical back: Normal range of motion.     Right lower leg: No edema.     Left lower leg: No edema.  Lymphadenopathy:     Cervical: No cervical adenopathy.  Skin:     General: Skin is warm and dry.     Capillary Refill: Capillary refill takes less than 2 seconds.  Neurological:     General: No focal deficit present.     Mental Status: He is alert and oriented to person, place, and time.  Psychiatric:  Mood and Affect: Mood normal.        Behavior: Behavior normal.    Labs reviewed: Basic Metabolic Panel: Recent Labs    03/13/20 1026 05/20/20 1201 11/15/20 0944  NA 134* 140 139  K 4.3 4.4 4.0  CL 98 101 102  CO2 25 30 28   GLUCOSE 88 116* 130*  BUN 18 15 22   CREATININE 1.12 0.97 1.28*  CALCIUM 8.6* 9.8 9.7  TSH  --  2.46  --    Liver Function Tests: Recent Labs    03/13/20 1509 05/20/20 1201 11/15/20 0944  AST 41 18 18  ALT 21 13 12   ALKPHOS 53  --   --   BILITOT 1.7* 1.3* 1.2  PROT 6.0* 6.4 6.5  ALBUMIN 3.0*  --   --    Recent Labs    03/13/20 1509  LIPASE 24   No results for input(s): AMMONIA in the last 8760 hours. CBC: Recent Labs    03/13/20 1026 05/20/20 1201 11/15/20 0944  WBC 4.2 5.0 5.2  NEUTROABS  --  2,855 3,214  HGB 14.2 14.1 13.9  HCT 40.8 41.4 42.2  MCV 90.7 93.2 95.0  PLT 107* 163 181   Lipid Panel: Recent Labs    02/05/20 1212  CHOL 176  HDL 48  LDLCALC 117*  TRIG 59  CHOLHDL 3.7   TSH: Recent Labs    05/20/20 1201  TSH 2.46   A1C: No results found for: HGBA1C   Assessment/Plan 1. Nocturia - suspect BPH - admits to going 5-10x during night - does not empty bladder, feels urge to urinate - discussed treatment options - does not want to see urologist or try Flomax  2. Essential hypertension - controlled - BUN/creat 22/1.28 11/15/2020 - cont lisinopril  3. Permanent atrial fibrillation ([place]) - followed by cardiology- advised to schedule yearly appointment - cont metoprolol - cont Eliquis for clot prevention  4. Chronic systolic heart failure ([facility]) - LV EF 30-35% - no recent weight fluctuations, sob or ankle edema - cont coreg and lisinopril  5. Nonischemic  cardiomyopathy ([place]) - see above  6. Stage 2 chronic kidney disease - continue to avoid nephrotoxic medications like NSAIDS and dose adjust medications to be renally excreted - hydrate  7. 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 Weight loss - down 7 lbs from last visit - discussed eating more calories or adding ensure daily  9. BMI 20.0-20.9, adult - see above  Total time: 35 minutes. Greater than 50% of total time spent doing patient education regarding treatment options for nocturia, interventions for weight gain, and medication management.   Future labs/tests: cbc/diff, bmp, hepatic panel, lipid panel   Next appt: 04/06/2021  Niel Hummer  [facility] [phone]

## 2020-12-01 NOTE — Patient Instructions (Signed)
Cut back on fluids and chocolate a few hours before bed time.   Please schedule appointment if nighttime urinary issues worsen  Please call cardiology office to schedule yearly appointment

## 2021-01-17 ENCOUNTER — Encounter (HOSPITAL_BASED_OUTPATIENT_CLINIC_OR_DEPARTMENT_OTHER): Payer: Self-pay | Admitting: Cardiology

## 2021-01-17 ENCOUNTER — Telehealth (HOSPITAL_BASED_OUTPATIENT_CLINIC_OR_DEPARTMENT_OTHER): Payer: Self-pay

## 2021-01-17 ENCOUNTER — Other Ambulatory Visit: Payer: Self-pay

## 2021-01-17 ENCOUNTER — Ambulatory Visit (INDEPENDENT_AMBULATORY_CARE_PROVIDER_SITE_OTHER): Payer: PPO | Admitting: Cardiology

## 2021-01-17 VITALS — BP 164/100 | HR 82 | Ht 71.0 in | Wt 153.0 lb

## 2021-01-17 DIAGNOSIS — I4821 Permanent atrial fibrillation: Secondary | ICD-10-CM

## 2021-01-17 DIAGNOSIS — D6869 Other thrombophilia: Secondary | ICD-10-CM

## 2021-01-17 DIAGNOSIS — I5022 Chronic systolic (congestive) heart failure: Secondary | ICD-10-CM

## 2021-01-17 DIAGNOSIS — I428 Other cardiomyopathies: Secondary | ICD-10-CM

## 2021-01-17 DIAGNOSIS — R030 Elevated blood-pressure reading, without diagnosis of hypertension: Secondary | ICD-10-CM | POA: Diagnosis not present

## 2021-01-17 NOTE — Telephone Encounter (Signed)
Patient assistance application faxed to [facility] for Eliquis 5 mg   Will await approval

## 2021-01-17 NOTE — Patient Instructions (Signed)

## 2021-01-17 NOTE — Progress Notes (Signed)
Cardiology Office Note:    Date:  01/17/2021   ID:  Austin Wong, DOB 1940-05-26, MRN [id number]  PCP:  Austin Alanis, NP  Cardiologist:  [name], MD PhD  Referring MD: Austin Alanis, NP   CC: follow up  History of Present Illness:    Austin Wong is a 80 y.o. male with a hx of atrial fibrillation (presented with RVR) and acute systolic and diastolic heart failure 04/5595. He presents today for follow up.  Please see discharge summary from 09/25/17 for full details, and study information included below. Briefly, Austin Wong had a several month history of dyspnea on exertion. He presented with afib RVR and was found to have flatly elevated troponins, elevated BNP, and bilateral pleural effusion. During his hospitalization he had an echo which showed reduced ejection fraction. 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 He was diuresed and started on medications. Of note, please see my notes during his hospitalization--we had long, extensive conversations regarding guideline medical therapy. Austin Wong has strong personal beliefs regarding medications and supplements to manage his health. As per my notes, his decision regarding medications was to take anticoagulation for atrial fibrillation, rate control and systolic heart failure management with carvedilol, and heart failure management with lisinopril. He declined TEE/CV, aspirin, statin, or entresto.  Today: Overall he is feeling okay aside from arthralgias that are not improving or worsening. He has tried multiple types of treatment with limited success. Since his last visit, he denies feeling any palpitations.  With some exertion he becomes short of breath. He is able to use his push mower for a couple passes before resting. Of note he states he can recuperate quickly. Also by the time he walks from the parking lot to a store  entrance he might be short of breath depending on the distance. For activity, he is often working on his car. Following intense activity, he will develop myalgias the next day, worse in his bilateral UE.  He notes his PCP reduced his antihypertensive by half due to low blood pressure readings. He has not been lightheaded at all. At home, his blood pressure has been stable close to 107-108/76. Since his blood pressure medication was decreased, he has not been checking his BP at home. He notes that he was anxious with the change to a new location today.  He denies any chest pain. No lightheadedness, headaches, syncope, orthopnea, PND, or lower extremity edema.  Past Medical History:  Diagnosis Date   Arthritis    Atrial fibrillation ([place])    History of high blood pressure     Past Surgical History:  Procedure Laterality Date   INGUINAL HERNIA REPAIR     bilateral   RIGHT/LEFT HEART CATH AND CORONARY ANGIOGRAPHY N/A 09/24/2017   Procedure: RIGHT/LEFT HEART CATH AND CORONARY ANGIOGRAPHY;  Surgeon: Leonie Man, MD;  Location: [facility];  Service: Cardiovascular;  Laterality: N/A;   TONSILLECTOMY      Current Medications: Current Outpatient Medications on File Prior to Visit  Medication Sig   apixaban (ELIQUIS) 5 MG TABS tablet Take 1 tablet (5 mg total) by mouth 2 (two) times daily.   B Complex Vitamins (VITAMIN B COMPLEX PO) Take 1 tablet by mouth daily.   carvedilol (COREG) 6.25 MG tablet Take 1 tablet (6.25 mg total) by mouth 2 (two) times daily with a meal.   Cholecalciferol (VITAMIN D3)  125 MCG/ML LIQD Place 4 drops under the tongue daily.   Coenzyme Q10 (COQ10) 100 MG CAPS Take 1 tablet by mouth every other day.   Lactobacillus (PROBIOTIC ACIDOPHILUS PO) Take 1 tablet by mouth daily.   lisinopril (ZESTRIL) 2.5 MG tablet Take 1 tablet (2.5 mg total) by mouth daily.   MILK THISTLE PO Take 1 capsule by mouth daily in the afternoon.   NON FORMULARY Take 2 tablets by mouth 2  (two) times daily. Bone Up  with K2 (MK-7) Calcium   Omega-3 Fatty Acids (FISH OIL CONCENTRATE PO) Take 1,600 mg by mouth daily in the afternoon.   vitamin C (ASCORBIC ACID) 500 MG tablet Take 500 mg by mouth 2 (two) times daily.   No current facility-administered medications on file prior to visit.  he stopped taking lisinopril   Allergies:   Metoprolol tartrate   Social History   Tobacco Use   Smoking status: Never   Smokeless tobacco: Never  Vaping Use   Vaping Use: Never used  Substance Use Topics   Alcohol use: Yes    Alcohol/week: 1.0 standard drink    Types: 1 Cans of beer per week    Comment: Occasonially    Drug use: Never  Widowed, wife Denny Peon passed away after open heart surgery in 2020.  Family History: The patient's family history includes CAD in his father; Heart attack (age of onset: 70) in his father; Hyperlipidemia in his father.  ROS:   Please see the history of present illness.   (+) Exertional shortness of breath (+) Arthralgias (+) Myalgias Additional pertinent ROS otherwise unremarkable.  EKGs/Labs/Other Studies Reviewed:    The following studies were reviewed today:  Echo 12/02/17 - Left ventricle: Wall thickness was increased in a pattern of   severe LVH. Systolic function was moderately to severely reduced.   The estimated ejection fraction was in the range of 30% to 35%.   Diffuse hypokinesis. - Aortic valve: There was mild regurgitation. - Left atrium: The atrium was mildly dilated. - Right ventricle: Systolic function was moderately reduced. - Right atrium: The atrium was mildly to moderately dilated.  Echo 09/22/17 Left ventricle: The cavity size was normal. There was mild   concentric hypertrophy. Systolic function was severely reduced.   The estimated ejection fraction was in the range of 20% to 25%.   Severe diffuse hypokinesis. There is akinesis of the inferior   myocardium. There is akinesis of the mid-apicalinferolateral    myocardium. There is akinesis of the basalanteroseptal   myocardium. The study was not technically sufficient to allow   evaluation of LV diastolic dysfunction due to atrial   fibrillation. - Aortic valve: Trileaflet; normal thickness, mildly calcified   leaflets. There was mild to moderate regurgitation. - Aorta: Aortic root dimension: 38 mm (ED). - Aortic root: The aortic root was mildly dilated. - Mitral valve: There was mild regurgitation. - Left atrium: The atrium was severely dilated. - Right atrium: The atrium was massively dilated. - Pulmonary arteries: PA peak pressure: 36 mm Hg (S). - Pericardium, extracardiac: There was a left pleural effusion.   Impressions:   - The right ventricular systolic pressure was increased consistent   with mild pulmonary hypertension.  Left and right heart cath 09/24/17 There is severe left ventricular systolic dysfunction. The left ventricular ejection fraction is less than 25% by visual estimate. LV end diastolic pressure is moderately elevated. 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 There is mild (2+) mitral regurgitation. --------------------------CORONARY ANATOMY-------------------------------- Colon Flattery  1st Diag lesion is 90% stenosed. Ost 2nd Diag lesion is 85% stenosed. Ost 2nd Diag to 2nd Diag lesion is 70% stenosed. Dist Cx lesion is 75% stenosed. Prox LAD to Mid LAD lesion is 20% stenosed.   Angiographically minimal CAD involving small Diag/Ramus branches and apical Lateral Cx-OM Severely dilated RA and RV with only mildly elevated pulmonary pressures. Severely dilated left ventricle with severe LV dysfunction (EF estimated may be 15 to 20%, but only mildly reduced cardiac output of 4.56 by FICK  EKG:  EKG is personally reviewed today.   01/17/2021: atrial fibrillation at 82 bpm 02/17/19: afib at 75 bpm, low voltage limb leads  Recent Labs: 05/20/2020: TSH 2.46 11/15/2020: ALT 12; BUN 22; Creat 1.28;  Hemoglobin 13.9; Platelets 181; Potassium 4.0; Sodium 139   Recent Lipid Panel    Component Value Date/Time   CHOL 176 02/05/2020 1212   TRIG 59 02/05/2020 1212   HDL 48 02/05/2020 1212   CHOLHDL 3.7 02/05/2020 1212   LDLCALC 117 (H) 02/05/2020 1212    Physical Exam:    VS:  BP (!) 164/100 (BP Location: Right Arm, Patient Position: Sitting, Cuff Size: Normal)   Pulse 82   Ht 5\' 11"  (1.803 m)   Wt 153 lb (69.4 kg)   BMI 21.34 kg/m     Wt Readings from Last 3 Encounters:  01/17/21 153 lb (69.4 kg)  12/01/20 146 lb 11.2 oz (66.5 kg)  05/20/20 153 lb 3.2 oz (69.5 kg)    GEN: Well nourished, well developed in no acute distress HEENT: Normal, moist mucous membranes NECK: No JVD CARDIAC: irregularly irregular rhythm, normal S1 and S2, no rubs or gallops. No murmur. VASCULAR: Radial and DP pulses 2+ bilaterally. No carotid bruits RESPIRATORY:  Clear to auscultation without rales, wheezing or rhonchi  ABDOMEN: Soft, non-tender, non-distended MUSCULOSKELETAL:  Ambulates independently SKIN: Warm and dry, no edema NEUROLOGIC:  Alert and oriented x 3. No focal neuro deficits noted. PSYCHIATRIC:  Normal affect   ASSESSMENT:    1. Permanent atrial fibrillation   2. Chronic systolic heart failure ([place])   3. Nonischemic cardiomyopathy ([place])   4. Secondary hypercoagulable state ([place])   5. Elevated blood pressure reading     PLAN:    Atrial fibrillation: rate controlled, permanent, asymptomatic -CHA2DS2/VAS Stroke Risk Points =3 . Continue apixaban. -rate control with carvedilol  Chronic systolic heart failure, nonischemic cardiomyopathy:   -euvolemic, NYHA class 1-2 -we have discussed his preferred GDMT at length, see prior notes. He feels well on current regimen -continue carvedilol, lisinopril. Declines entreso, ICD  Elevated blood pressure reading: abnormal for him. Recently had meds reduced due to hypertension. He will resume monitoring at home and contact me if  elevated.  Plan for follow up: 1 year or sooner PRN  Medication Adjustments/Labs and Tests Ordered: Current medicines are reviewed at length with the patient today.  Concerns regarding medicines are outlined above.   Orders Placed This Encounter  Procedures   EKG 12-Lead    No orders of the defined types were placed in this encounter.  Patient Instructions  Medication Instructions:  Your Physician recommend you continue on your current medication as directed.    *If you need a refill on your cardiac medications before your next appointment, please call your pharmacy*   Lab Work: None ordered today   Testing/Procedures: None ordered today   Follow-Up: At [facility], you and your health needs are our priority.  As part of our continuing mission to provide you  with exceptional heart care, we have created designated Provider Care Teams.  These Care Teams include your primary Cardiologist (physician) and Advanced Practice Providers (APPs -  Physician Assistants and Nurse Practitioners) who all work together to provide you with the care you need, when you need it.  We recommend signing up for the patient portal called "MyChart".  Sign up information is provided on this After Visit Summary.  MyChart is used to connect with patients for Virtual Visits (Telemedicine).  Patients are able to view lab/test results, encounter notes, upcoming appointments, etc.  Non-urgent messages can be sent to your provider as well.   To learn more about what you can do with MyChart, go to [email or web address].    Your next appointment:   1 year(s)  The format for your next appointment:   In Person  Provider:   [name], MD      [facility],acting as a scribe for [name], MD.,have documented all relevant documentation on the behalf of [name], MD,as directed by  [name], MD while in the presence of [name], MD.  I,  [name], MD, have reviewed all documentation for this visit. The documentation on 01/17/21 for the exam, diagnosis, procedures, and orders are all accurate and complete.   Signed, [name], MD PhD 01/17/2021   [place]

## 2021-01-22 ENCOUNTER — Encounter: Payer: Self-pay | Admitting: Cardiology

## 2021-02-15 ENCOUNTER — Other Ambulatory Visit: Payer: Self-pay | Admitting: Cardiology

## 2021-02-15 DIAGNOSIS — I5022 Chronic systolic (congestive) heart failure: Secondary | ICD-10-CM

## 2021-02-15 DIAGNOSIS — I428 Other cardiomyopathies: Secondary | ICD-10-CM

## 2021-02-23 NOTE — Telephone Encounter (Signed)
Received fax from [facility] Patient Assistance stating pt has been approved from 1/23-12/23.  Pt made aware.

## 2021-03-06 ENCOUNTER — Other Ambulatory Visit (HOSPITAL_BASED_OUTPATIENT_CLINIC_OR_DEPARTMENT_OTHER): Payer: Self-pay | Admitting: Cardiology

## 2021-03-06 DIAGNOSIS — I4821 Permanent atrial fibrillation: Secondary | ICD-10-CM

## 2021-03-06 MED ORDER — APIXABAN 5 MG PO TABS: 5.0000 mg | ORAL_TABLET | Freq: Two times a day (BID) | ORAL | 1 refills | Status: DC

## 2021-03-06 NOTE — Telephone Encounter (Signed)
Prescription refill request for Eliquis received. Indication: a fib Last office visit: 01/17/21 Scr: 1.28 Age: 81 Weight: 69kg

## 2021-04-03 ENCOUNTER — Other Ambulatory Visit: Payer: Self-pay

## 2021-04-03 ENCOUNTER — Other Ambulatory Visit: Payer: PPO

## 2021-04-03 DIAGNOSIS — R748 Abnormal levels of other serum enzymes: Secondary | ICD-10-CM | POA: Diagnosis not present

## 2021-04-03 DIAGNOSIS — Z13220 Encounter for screening for lipoid disorders: Secondary | ICD-10-CM

## 2021-04-03 DIAGNOSIS — I10 Essential (primary) hypertension: Secondary | ICD-10-CM | POA: Diagnosis not present

## 2021-04-03 LAB — CBC WITH DIFFERENTIAL/PLATELET
Absolute Monocytes: 475 cells/uL (ref 200–950)
Basophils Absolute: 60 cells/uL (ref 0–200)
Basophils Relative: 1.2 %
Eosinophils Absolute: 160 cells/uL (ref 15–500)
Eosinophils Relative: 3.2 %
HCT: 42.5 % (ref 38.5–50.0)
Hemoglobin: 14.4 g/dL (ref 13.2–17.1)
Lymphs Abs: 1235 cells/uL (ref 850–3900)
MCH: 31.4 pg (ref 27.0–33.0)
MCHC: 33.9 g/dL (ref 32.0–36.0)
MCV: 92.6 fL (ref 80.0–100.0)
MPV: 11.9 fL (ref 7.5–12.5)
Monocytes Relative: 9.5 %
Neutro Abs: 3070 cells/uL (ref 1500–7800)
Neutrophils Relative %: 61.4 %
Platelets: 137 10*3/uL — ABNORMAL LOW (ref 140–400)
RBC: 4.59 10*6/uL (ref 4.20–5.80)
RDW: 12.6 % (ref 11.0–15.0)
Total Lymphocyte: 24.7 %
WBC: 5 10*3/uL (ref 3.8–10.8)

## 2021-04-03 LAB — LIPID PANEL
Cholesterol: 166 mg/dL (ref <200)
HDL: 43 mg/dL (ref >=40)
LDL Cholesterol (Calc): 108 mg/dL (calc) — ABNORMAL HIGH
Non-HDL Cholesterol (Calc): 123 mg/dL (calc) (ref <130)
Total CHOL/HDL Ratio: 3.9 (calc) (ref <5.0)
Triglycerides: 68 mg/dL (ref <150)

## 2021-04-03 LAB — HEPATIC FUNCTION PANEL
AG Ratio: 1.7 (calc) (ref 1.0–2.5)
ALT: 17 U/L (ref 9–46)
AST: 21 U/L (ref 10–35)
Albumin: 4.1 g/dL (ref 3.6–5.1)
Alkaline phosphatase (APISO): 79 U/L (ref 35–144)
Bilirubin, Direct: 0.4 mg/dL — ABNORMAL HIGH (ref 0.0–0.2)
Globulin: 2.4 g/dL (calc) (ref 1.9–3.7)
Indirect Bilirubin: 1.2 mg/dL (calc) (ref 0.2–1.2)
Total Bilirubin: 1.6 mg/dL — ABNORMAL HIGH (ref 0.2–1.2)
Total Protein: 6.5 g/dL (ref 6.1–8.1)

## 2021-04-03 LAB — BASIC METABOLIC PANEL
BUN: 23 mg/dL (ref 7–25)
CO2: 28 mmol/L (ref 20–32)
Calcium: 9.5 mg/dL (ref 8.6–10.3)
Chloride: 100 mmol/L (ref 98–110)
Creat: 1.05 mg/dL (ref 0.70–1.22)
Glucose, Bld: 111 mg/dL — ABNORMAL HIGH (ref 65–99)
Potassium: 4 mmol/L (ref 3.5–5.3)
Sodium: 138 mmol/L (ref 135–146)

## 2021-04-06 ENCOUNTER — Other Ambulatory Visit: Payer: Self-pay

## 2021-04-06 ENCOUNTER — Encounter: Payer: Self-pay | Admitting: Orthopedic Surgery

## 2021-04-06 ENCOUNTER — Ambulatory Visit (INDEPENDENT_AMBULATORY_CARE_PROVIDER_SITE_OTHER): Payer: PPO | Admitting: Orthopedic Surgery

## 2021-04-06 VITALS — BP 142/92 | HR 83 | Temp 96.8°F | Ht 71.0 in | Wt 154.0 lb

## 2021-04-06 DIAGNOSIS — I4821 Permanent atrial fibrillation: Secondary | ICD-10-CM

## 2021-04-06 DIAGNOSIS — M159 Polyosteoarthritis, unspecified: Secondary | ICD-10-CM

## 2021-04-06 DIAGNOSIS — R351 Nocturia: Secondary | ICD-10-CM | POA: Diagnosis not present

## 2021-04-06 DIAGNOSIS — N182 Chronic kidney disease, stage 2 (mild): Secondary | ICD-10-CM

## 2021-04-06 DIAGNOSIS — R17 Unspecified jaundice: Secondary | ICD-10-CM

## 2021-04-06 DIAGNOSIS — I5022 Chronic systolic (congestive) heart failure: Secondary | ICD-10-CM

## 2021-04-06 DIAGNOSIS — I10 Essential (primary) hypertension: Secondary | ICD-10-CM | POA: Diagnosis not present

## 2021-04-06 DIAGNOSIS — H903 Sensorineural hearing loss, bilateral: Secondary | ICD-10-CM

## 2021-04-06 DIAGNOSIS — L853 Xerosis cutis: Secondary | ICD-10-CM

## 2021-04-06 NOTE — Progress Notes (Signed)
Careteam: Patient Care Team: [name], NP as PCP - General (Adult Health Nurse Practitioner) [name], MD as PCP - Cardiology (Cardiology)  Seen by: Windell Moulding, AGNP-C  PLACE OF SERVICE:  [place] Directive information Does Patient Have a Medical Advance Directive?: Yes, Type of Advance Directive: [place];Living will, Does patient want to make changes to medical advance directive?: No - Patient declined  Allergies  Allergen Reactions   Metoprolol Tartrate Nausea Only    Chief Complaint  Patient presents with   Medical Management of Chronic Issues    4 month follow up and discuss recent labs (copy given to patient) . Discuss the need for COVID, Shingrix, and Pneumonia vaccine or postpone if patient refuses.     HPI: Patient is a 81 y.o. male seen today for medical management of chronic conditions.   Lab discussed with patient.   Nocturia- improved, taking supplement with saw palmetto, now going about 3x nightly, denies insomnia  Afib- saw Dr. Harrell Gave 12/2020, no changes to medications, remains on Eliquis for clot prevention, HR controlled with carvedilol  HTN- BUN/creat 23/1.05 02/06, remains on lisinopril  CHF- declined Entresto in past, remains on carvedilol and lisinopril, denies weight fluctuations and ankle edema, reports mild sob with exertion  Bilateral shoulder pain-  does not like taking medications, does stretches in the morning, he reports staying active reduces pain  Dry skin- applying E-cream, discussed using humidifier at night  Hearing loss- he has hearing aids but stopped wearing them, he does have a audiologist but has not seen them in awhile, denies changes to hearing, no issues during our encounter  Review of Systems:  Review of Systems  Constitutional:  Negative for chills, fever, malaise/fatigue and weight loss.  HENT:  Positive for hearing loss. Negative for sore throat.   Eyes:  Negative  for blurred vision and double vision.  Respiratory:  Negative for cough, shortness of breath and wheezing.   Cardiovascular:  Negative for chest pain and leg swelling.  Gastrointestinal:  Negative for abdominal pain, blood in stool, constipation, diarrhea, heartburn, nausea and vomiting.  Genitourinary:  Negative for dysuria, frequency and hematuria.       Nocturia  Musculoskeletal:  Positive for joint pain. Negative for falls.  Skin:        Dry skin  Neurological:  Negative for dizziness, weakness and headaches.  Psychiatric/Behavioral:  Negative for depression. The patient is not nervous/anxious and does not have insomnia.    Past Medical History:  Diagnosis Date   Arthritis    Atrial fibrillation ([place])    History of high blood pressure    Past Surgical History:  Procedure Laterality Date   INGUINAL HERNIA REPAIR     bilateral   RIGHT/LEFT HEART CATH AND CORONARY ANGIOGRAPHY N/A 09/24/2017   Procedure: RIGHT/LEFT HEART CATH AND CORONARY ANGIOGRAPHY;  Surgeon: Leonie Man, MD;  Location: [facility];  Service: Cardiovascular;  Laterality: N/A;   TONSILLECTOMY     Social History:   reports that he has never smoked. He has never used smokeless tobacco. He reports current alcohol use of about 1.0 standard drink per week. He reports that he does not use drugs.  Family History  Problem Relation Age of Onset   CAD Father    Hyperlipidemia Father    Heart attack Father 89    Medications: Patient's Medications  New Prescriptions   No medications on file  Previous Medications   APIXABAN (ELIQUIS)  5 MG TABS TABLET    Take 1 tablet (5 mg total) by mouth 2 (two) times daily.   B COMPLEX VITAMINS (VITAMIN B COMPLEX PO)    Take 1 tablet by mouth daily.   CARVEDILOL (COREG) 6.25 MG TABLET    Take 1 tablet (6.25 mg total) by mouth 2 (two) times daily with a meal.   CHOLECALCIFEROL (VITAMIN D3) 125 MCG/ML LIQD    Place 4 drops under the tongue daily.   COENZYME Q10 (COQ10) 100  MG CAPS    Take 1 tablet by mouth every other day.   LACTOBACILLUS (PROBIOTIC ACIDOPHILUS PO)    Take 1 tablet by mouth daily.   LISINOPRIL (ZESTRIL) 2.5 MG TABLET    TAKE ONE TABLET BY MOUTH EVERY DAY   MILK THISTLE PO    Take 1 capsule by mouth daily in the afternoon.   NON FORMULARY    Take 2 tablets by mouth 2 (two) times daily. Bone Up  with K2 (MK-7) Calcium   OMEGA-3 FATTY ACIDS (FISH OIL CONCENTRATE PO)    Take 1,600 mg by mouth daily in the afternoon.   VITAMIN C (ASCORBIC ACID) 500 MG TABLET    Take 500 mg by mouth 2 (two) times daily.  Modified Medications   No medications on file  Discontinued Medications   No medications on file    Physical Exam:  Vitals:   04/06/21 1050  BP: (!) 142/92  Pulse: 83  Temp: (!) 96.8 F (36 C)  TempSrc: Temporal  SpO2: 95%  Weight: 154 lb (69.9 kg)  Height: 5\' 11"  (1.803 m)   Body mass index is 21.48 kg/m. Wt Readings from Last 3 Encounters:  04/06/21 154 lb (69.9 kg)  01/17/21 153 lb (69.4 kg)  12/01/20 146 lb 11.2 oz (66.5 kg)    Physical Exam Vitals reviewed.  Constitutional:      General: He is not in acute distress. HENT:     Head: Normocephalic.     Right Ear: There is no impacted cerumen.     Left Ear: There is no impacted cerumen.     Nose: Nose normal.     Mouth/Throat:     Mouth: Mucous membranes are moist.  Eyes:     General:        Right eye: No discharge.        Left eye: No discharge.  Cardiovascular:     Rate and Rhythm: Normal rate. Rhythm irregular.     Pulses: Normal pulses.     Heart sounds: Normal heart sounds. No murmur heard. Pulmonary:     Effort: Pulmonary effort is normal. No respiratory distress.     Breath sounds: Normal breath sounds. No wheezing.  Abdominal:     General: Bowel sounds are normal. There is no distension.     Palpations: Abdomen is soft.  Musculoskeletal:     Right shoulder: No swelling, deformity or tenderness. Normal range of motion. Normal strength.     Left shoulder:  No swelling, deformity or tenderness. Normal range of motion. Normal strength.     Cervical back: Neck supple.     Right lower leg: No edema.     Left lower leg: No edema.  Skin:    General: Skin is warm and dry.     Capillary Refill: Capillary refill takes less than 2 seconds.  Neurological:     General: No focal deficit present.     Mental Status: He is alert and oriented to person, place,  and time.     Motor: No weakness.     Gait: Gait normal.  Psychiatric:        Mood and Affect: Mood normal.        Behavior: Behavior normal.    Labs reviewed: Basic Metabolic Panel: Recent Labs    05/20/20 1201 11/15/20 0944 04/03/21 0845  NA 140 139 138  K 4.4 4.0 4.0  CL 101 102 100  CO2 30 28 28   GLUCOSE 116* 130* 111*  BUN 15 22 23   CREATININE 0.97 1.28* 1.05  CALCIUM 9.8 9.7 9.5  TSH 2.46  --   --    Liver Function Tests: Recent Labs    05/20/20 1201 11/15/20 0944 04/03/21 0845  AST 18 18 21   ALT 13 12 17   BILITOT 1.3* 1.2 1.6*  PROT 6.4 6.5 6.5   No results for input(s): LIPASE, AMYLASE in the last 8760 hours. No results for input(s): AMMONIA in the last 8760 hours. CBC: Recent Labs    05/20/20 1201 11/15/20 0944 04/03/21 0845  WBC 5.0 5.2 5.0  NEUTROABS 2,855 3,214 3,070  HGB 14.1 13.9 14.4  HCT 41.4 42.2 42.5  MCV 93.2 95.0 92.6  PLT 163 181 137*   Lipid Panel: Recent Labs    04/03/21 0845  CHOL 166  HDL 43  LDLCALC 108*  TRIG 68  CHOLHDL 3.9   TSH: Recent Labs    05/20/20 1201  TSH 2.46   A1C: No results found for: HGBA1C   Assessment/Plan 1. Nocturia -  reports going 3x/night - improved with saw palmetto supplement - not interested in further workup or medication at this time  2. Permanent atrial fibrillation (HCC) - CHA2DS2/VAS score 3 -cont carvedilol - cont Eliquis for clot prevention  3. Essential hypertension - BUN/creat 23/1.05 02/06 - controlled - cont carvedilol and lisinopril  4. Chronic systolic heart failure  (HCC) - LVEF 30-35% 2019 - declined Entresto - reports some sob with exertion - cont carvedilol and lisinopril  5. Stage 2 chronic kidney disease - BUN/creat 23/1.05 02/06 - cont to avoid nephrotoxic drugs like NSAIDS and dose adjust medications to be renally excreted  6. Osteoarthritis involving multiple joints on both sides of body - shoulders most painful, exam unremarkable - cont daily stretching - recommend tylenol prn for pain  7. Dry skin - using OTC lotion - recommend using humidifier at night  8. Sensorineural hearing loss (SNHL) of both ears - past use of hearing aids - advised to f/u with audiology  9. Elevated bilirubin - bilirubin 1.6 02/06 - asymptomatic  Total time: 35 minutes. Greater than 50% of total time spent doing patient education regarding nocturia, afib, heart failure, and joint pain.    Next appt: Visit date not found  [place] [phone]

## 2021-04-06 NOTE — Patient Instructions (Addendum)
Schedule with Audiology to check hearing aids  Flush ears in shower  May take tylenol for bad pain- recommend 1000 mg daily  May use humidifier at night for dry skin

## 2021-04-25 ENCOUNTER — Other Ambulatory Visit (HOSPITAL_BASED_OUTPATIENT_CLINIC_OR_DEPARTMENT_OTHER): Payer: Self-pay | Admitting: Cardiology

## 2021-04-25 DIAGNOSIS — I4821 Permanent atrial fibrillation: Secondary | ICD-10-CM

## 2021-04-25 MED ORDER — APIXABAN 5 MG PO TABS: 5.0000 mg | ORAL_TABLET | Freq: Two times a day (BID) | ORAL | 1 refills | Status: DC

## 2021-04-25 NOTE — Telephone Encounter (Signed)
Prescription refill request for Eliquis received. Indication: a fib Last office visit: a fib Scr: 1.05 Age: 81 Weight: 69kg

## 2021-04-25 NOTE — Telephone Encounter (Signed)
Received fax from [facility] requesting an Rx for Eliquis 5 mg po bid, #[phone] with refills.

## 2021-05-15 ENCOUNTER — Other Ambulatory Visit: Payer: Self-pay | Admitting: Orthopedic Surgery

## 2021-05-15 DIAGNOSIS — I4821 Permanent atrial fibrillation: Secondary | ICD-10-CM

## 2021-05-15 DIAGNOSIS — I5022 Chronic systolic (congestive) heart failure: Secondary | ICD-10-CM

## 2021-05-15 DIAGNOSIS — I428 Other cardiomyopathies: Secondary | ICD-10-CM

## 2021-05-15 NOTE — Telephone Encounter (Signed)
High risk warning populated when attempting to approve refill request. Will send to [place], Amy E, NP to review.   

## 2021-07-14 IMAGING — CT CT HEAD W/O CM
3 of 4 series · 16 of 47 positions shown, 19 images · non-contrast
Comparison: None.

CLINICAL DATA: Altered mental status.

EXAM:
CT HEAD WITHOUT CONTRAST
TECHNIQUE: Contiguous axial images were obtained from the base of the skull
through the vertex without intravenous contrast.

[Series 4: head 2.0 h70h · axial · 0.46mm/px · z∈[-108,+30]mm · 10 of 81 slices shown, 13 images]
[im 8/81  brain]
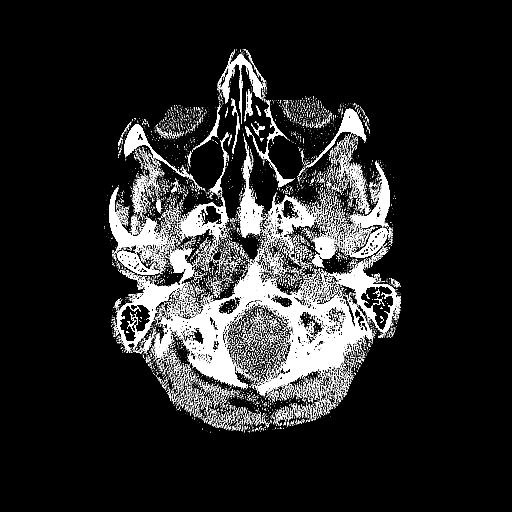
[im 8/81  bone]
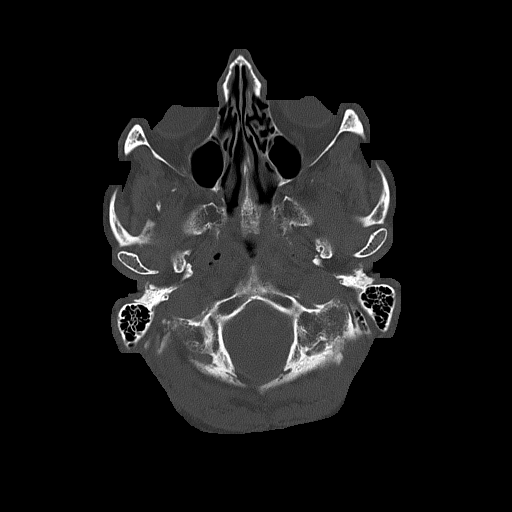
[im 16/81  brain]
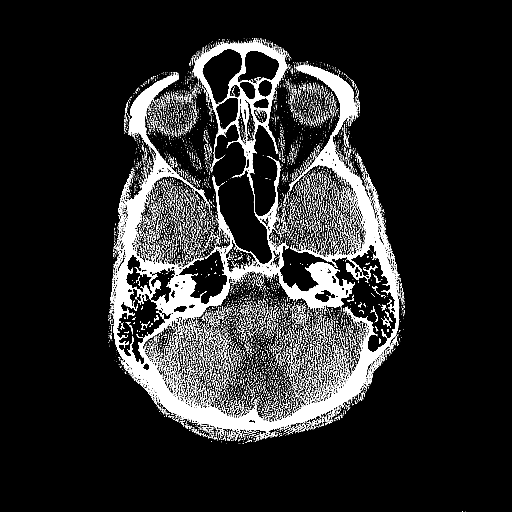
[im 23/81  brain]
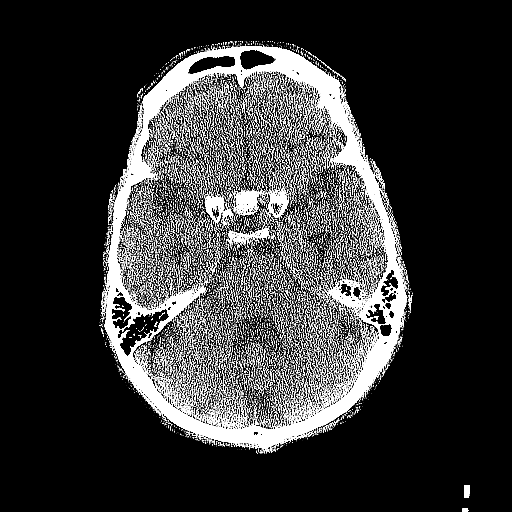
[im 31/81  brain]
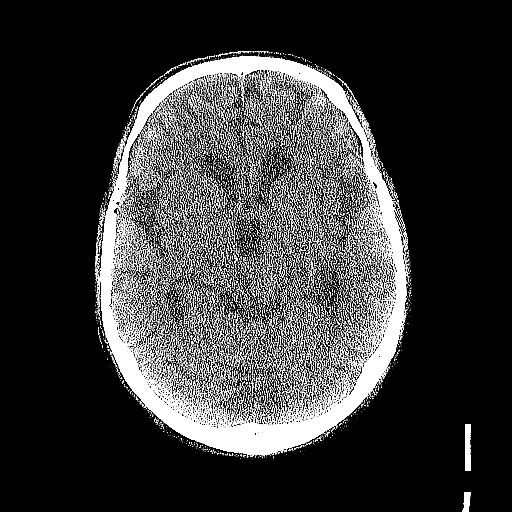
[im 39/81  brain]
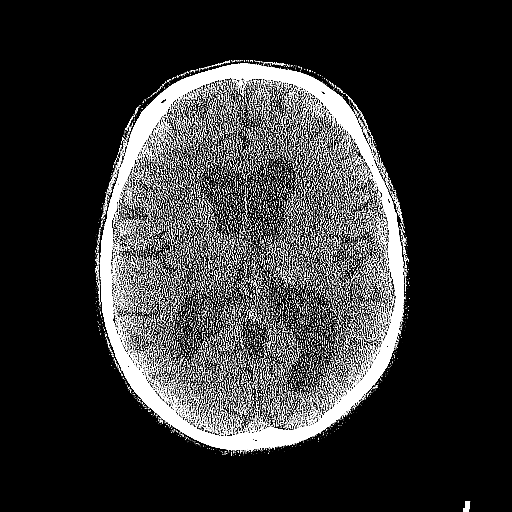
[im 39/81  bone]
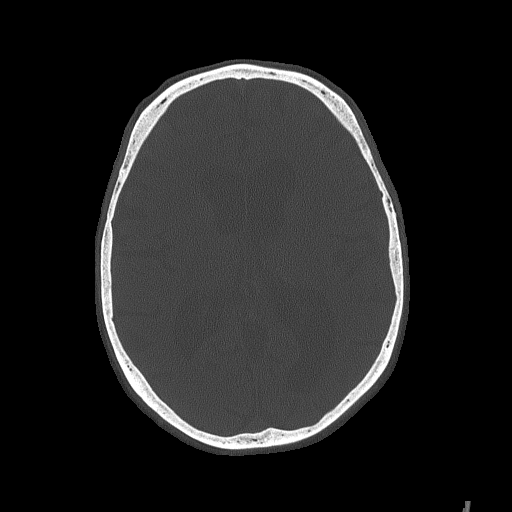
[im 46/81  brain]
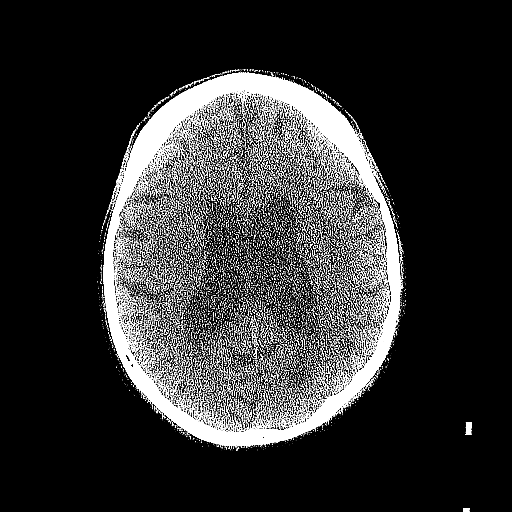
[im 54/81  brain]
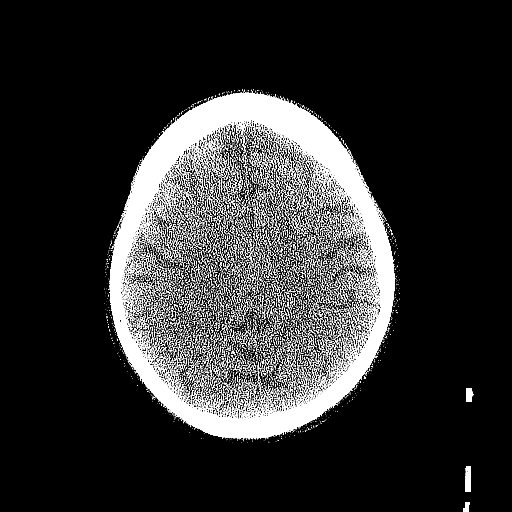
[im 61/81  brain]
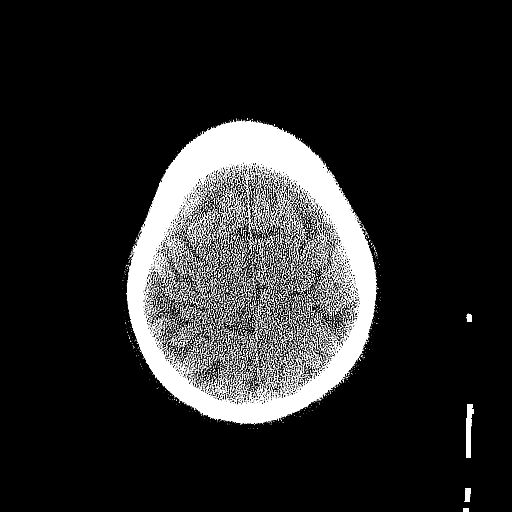
[im 69/81  brain]
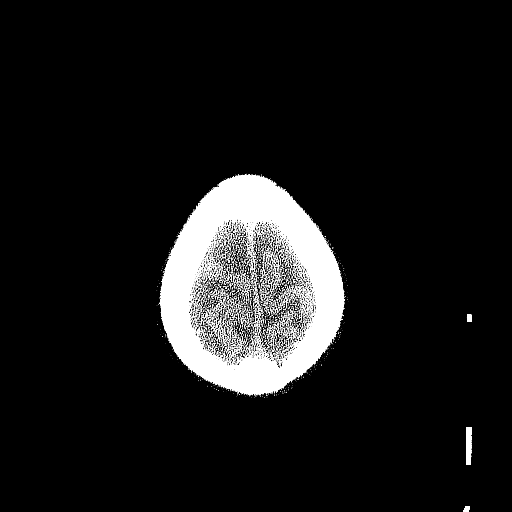
[im 69/81  bone]
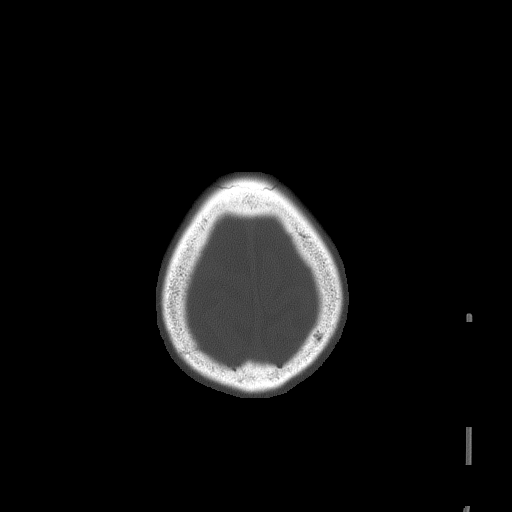
[im 77/81  brain]
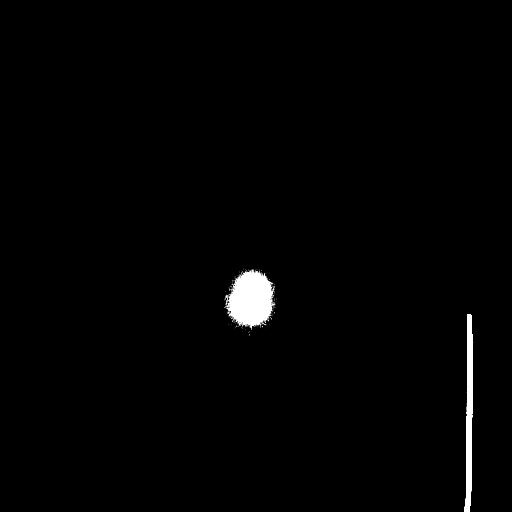

[Series 5: head 3.0 mpr cor · coronal · 0.35mm/px · 3 of 70 slices shown]
[im 24/70  brain]
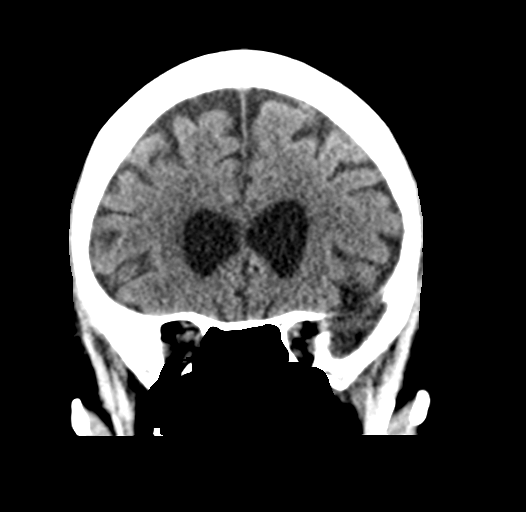
[im 31/70  brain]
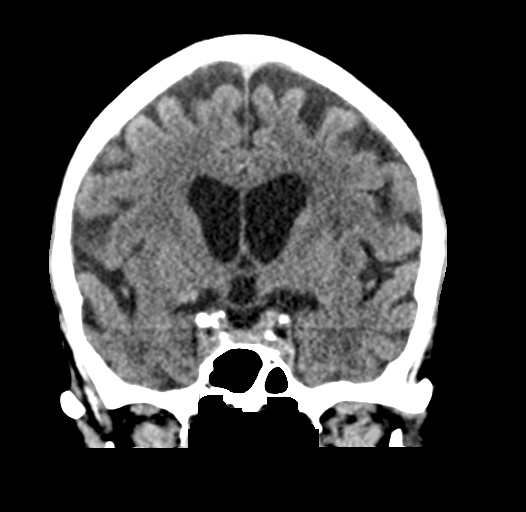
[im 39/70  brain]
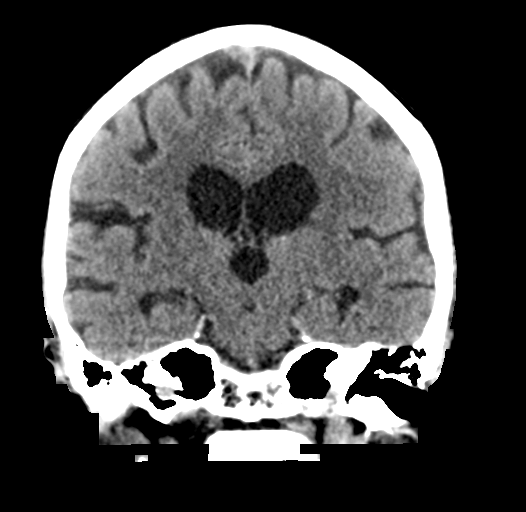

[Series 6: head 3.0 mpr sag · sagittal · 0.37mm/px · 3 of 57 slices shown]
[im 19/57  brain]
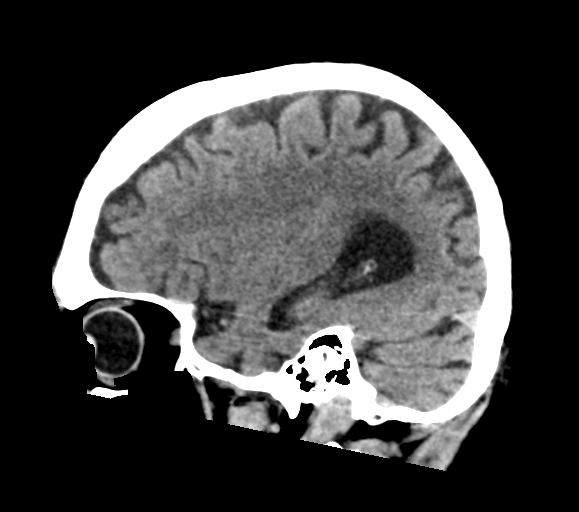
[im 29/57  brain]
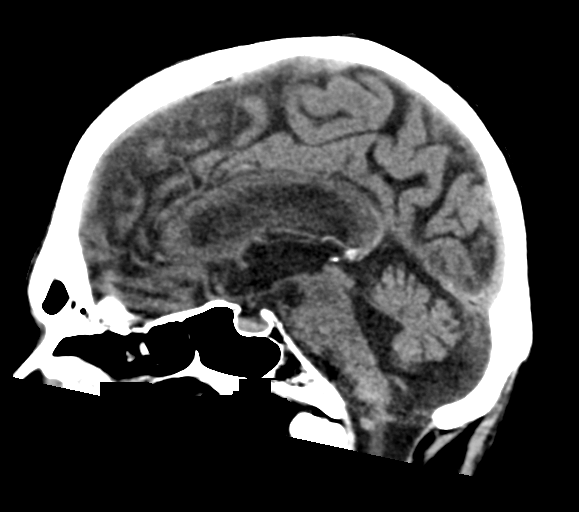
[im 38/57  brain]
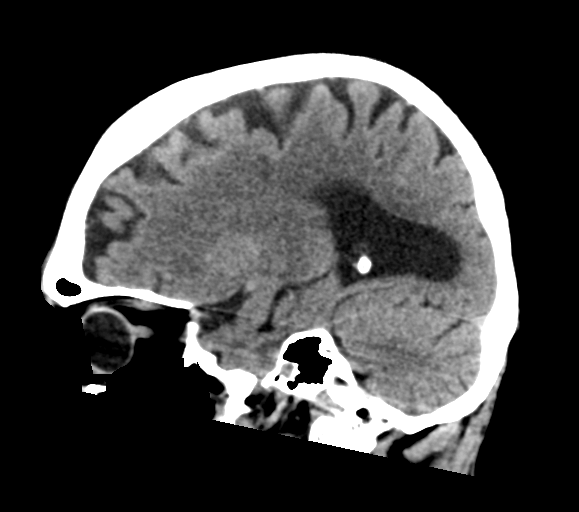

[16 of 47 positions shown; findings below may reference images not displayed]

FINDINGS: Brain: There is mild to moderate severity cerebral atrophy with
widening of the extra-axial spaces and ventricular dilatation.
There are areas of decreased attenuation within the white matter
tracts of the supratentorial brain, consistent with microvascular
disease changes.

Vascular: No hyperdense vessel or unexpected calcification.

Skull: Normal. Negative for fracture or focal lesion.

Sinuses/Orbits: There is mild bilateral ethmoid sinus mucosal
thickening.

Other: None.
IMPRESSION: 1. Generalized cerebral atrophy.
2. No acute intracranial abnormality.

## 2021-08-18 ENCOUNTER — Other Ambulatory Visit: Payer: Self-pay | Admitting: Orthopedic Surgery

## 2021-08-18 DIAGNOSIS — I428 Other cardiomyopathies: Secondary | ICD-10-CM

## 2021-08-18 DIAGNOSIS — I4821 Permanent atrial fibrillation: Secondary | ICD-10-CM

## 2021-08-18 DIAGNOSIS — I5022 Chronic systolic (congestive) heart failure: Secondary | ICD-10-CM

## 2021-10-19 ENCOUNTER — Encounter: Payer: Self-pay | Admitting: Orthopedic Surgery

## 2021-10-19 ENCOUNTER — Ambulatory Visit (INDEPENDENT_AMBULATORY_CARE_PROVIDER_SITE_OTHER): Payer: PPO | Admitting: Orthopedic Surgery

## 2021-10-19 VITALS — BP 154/98 | HR 87 | Temp 96.9°F | Resp 20 | Ht 71.0 in | Wt 154.8 lb

## 2021-10-19 DIAGNOSIS — H6121 Impacted cerumen, right ear: Secondary | ICD-10-CM | POA: Diagnosis not present

## 2021-10-19 NOTE — Progress Notes (Signed)
Careteam: Patient Care Team: Austin Alanis, NP as PCP - General (Adult Health Nurse Practitioner) [name], MD as PCP - Cardiology (Cardiology)  Seen by: Windell Moulding, AGNP-C  PLACE OF SERVICE:  [facility] Directive information    Allergies  Allergen Reactions   Metoprolol Tartrate Nausea Only    Chief Complaint  Patient presents with   Acute Visit    Patient presents today for right ear concerns. He reports that it feels like ear is full with water but he doesn't have any pain at the moment. He reports flushing his ears out with peroxide at home.     HPI: Patient is a 81 y.o. male seen today for acute visit due to right ear fullness.   Noticed right ear fullness 2 weeks ago. Wears hearing aid to right ear. Denies ear pain or ear discharge. He saw audiologist earlier this year, reports mild hearing decline. He tried to flush ear with peroxide a few day ago without success. After ear examination, a small piece of hearing aid was lodged in ear canal, removed with forceps.   Review of Systems:  Review of Systems  Constitutional:  Negative for fever and malaise/fatigue.  HENT:  Positive for hearing loss. Negative for ear discharge, ear pain and tinnitus.   Respiratory:  Negative for cough.   Cardiovascular:  Negative for chest pain.  Psychiatric/Behavioral:  Negative for depression. The patient is not nervous/anxious.     Past Medical History:  Diagnosis Date   Arthritis    Atrial fibrillation ([place])    History of high blood pressure    Past Surgical History:  Procedure Laterality Date   INGUINAL HERNIA REPAIR     bilateral   RIGHT/LEFT HEART CATH AND CORONARY ANGIOGRAPHY N/A 09/24/2017   Procedure: RIGHT/LEFT HEART CATH AND CORONARY ANGIOGRAPHY;  Surgeon: Leonie Man, MD;  Location: [facility];  Service: Cardiovascular;  Laterality: N/A;   TONSILLECTOMY     Social History:   reports that he has never smoked. He has never used  smokeless tobacco. He reports current alcohol use of about 1.0 standard drink of alcohol per week. He reports that he does not use drugs.  Family History  Problem Relation Age of Onset   CAD Father    Hyperlipidemia Father    Heart attack Father 57    Medications: Patient's Medications  New Prescriptions   No medications on file  Previous Medications   APIXABAN (ELIQUIS) 5 MG TABS TABLET    Take 1 tablet (5 mg total) by mouth 2 (two) times daily.   B COMPLEX VITAMINS (VITAMIN B COMPLEX PO)    Take 1 tablet by mouth daily.   CARVEDILOL (COREG) 6.25 MG TABLET    TAKE 1 TABLET BY MOUTH TWICE DAILY WITH A MEAL   CHOLECALCIFEROL (VITAMIN D3) 125 MCG/ML LIQD    Place 4 drops under the tongue daily.   COENZYME Q10 (COQ10) 100 MG CAPS    Take 1 tablet by mouth every other day.   LACTOBACILLUS (PROBIOTIC ACIDOPHILUS PO)    Take 1 tablet by mouth daily.   LISINOPRIL (ZESTRIL) 2.5 MG TABLET    TAKE ONE TABLET BY MOUTH EVERY DAY   MILK THISTLE PO    Take 1 capsule by mouth daily in the afternoon.   NON FORMULARY    Take 2 tablets by mouth 2 (two) times daily. Bone Up  with K2 (MK-7) Calcium   OMEGA-3 FATTY ACIDS (FISH OIL CONCENTRATE  PO)    Take 1,600 mg by mouth daily in the afternoon.   VITAMIN C (ASCORBIC ACID) 500 MG TABLET    Take 500 mg by mouth 2 (two) times daily.  Modified Medications   No medications on file  Discontinued Medications   No medications on file    Physical Exam:  Vitals:   10/19/21 0938  BP: (!) 154/98  Pulse: 87  Resp: 20  Temp: (!) 96.9 F (36.1 C)  SpO2: 98%  Weight: 154 lb 12.8 oz (70.2 kg)  Height: '5\' 11"'$  (1.803 m)   Body mass index is 21.59 kg/m. Wt Readings from Last 3 Encounters:  10/19/21 154 lb 12.8 oz (70.2 kg)  04/06/21 154 lb (69.9 kg)  01/17/21 153 lb (69.4 kg)    Physical Exam Vitals reviewed.  Constitutional:      General: He is not in acute distress. HENT:     Head: Normocephalic.     Right Ear: There is impacted cerumen.      Left Ear: There is no impacted cerumen.     Ears:     Comments: Cannot visualize right TM due to foreign object and cerumen  Cardiovascular:     Rate and Rhythm: Normal rate. Rhythm irregular.     Pulses: Normal pulses.     Heart sounds: Normal heart sounds.  Pulmonary:     Effort: Pulmonary effort is normal. No respiratory distress.     Breath sounds: Normal breath sounds. No wheezing.  Neurological:     General: No focal deficit present.     Mental Status: He is oriented to person, place, and time.  Psychiatric:        Mood and Affect: Mood normal.        Behavior: Behavior normal.     Labs reviewed: Basic Metabolic Panel: Recent Labs    11/15/20 0944 04/03/21 0845  NA 139 138  K 4.0 4.0  CL 102 100  CO2 28 28  GLUCOSE 130* 111*  BUN 22 23  CREATININE 1.28* 1.05  CALCIUM 9.7 9.5   Liver Function Tests: Recent Labs    11/15/20 0944 04/03/21 0845  AST 18 21  ALT 12 17  BILITOT 1.2 1.6*  PROT 6.5 6.5   No results for input(s): "LIPASE", "AMYLASE" in the last 8760 hours. No results for input(s): "AMMONIA" in the last 8760 hours. CBC: Recent Labs    11/15/20 0944 04/03/21 0845  WBC 5.2 5.0  NEUTROABS 3,214 3,070  HGB 13.9 14.4  HCT 42.2 42.5  MCV 95.0 92.6  PLT 181 137*   Lipid Panel: Recent Labs    04/03/21 0845  CHOL 166  HDL 43  LDLCALC 108*  TRIG 68  CHOLHDL 3.9   TSH: No results for input(s): "TSH" in the last 8760 hours. A1C: No results found for: "HGBA1C"   Assessment/Plan 1. Hearing loss of right ear due to cerumen impaction - small foreign object in ear canal, also cerumen - foreign object extracted with forcepts- small piece of hearing aid - Ear Lavage - advised gentle application of hearing aids - recommend using Debrox 5 days every month to prevention cerumen impaction  Total time: 15 minutes. Greater than 50% of total time spent doing patient education regarding hearing aid use, cerumen impaction.    Next appt: Visit date  not found  [place], [facility] [phone]

## 2021-10-19 NOTE — Patient Instructions (Signed)
Recommend using Debrox (peroxide) ear drops every month- apply 5 drops to both ears every night, flush ears in shower with warm water.

## 2021-11-13 ENCOUNTER — Other Ambulatory Visit (HOSPITAL_BASED_OUTPATIENT_CLINIC_OR_DEPARTMENT_OTHER): Payer: Self-pay | Admitting: Cardiology

## 2021-11-13 DIAGNOSIS — I428 Other cardiomyopathies: Secondary | ICD-10-CM

## 2021-11-13 DIAGNOSIS — I5022 Chronic systolic (congestive) heart failure: Secondary | ICD-10-CM

## 2021-11-13 NOTE — Telephone Encounter (Signed)
Rx request sent to pharmacy.  

## 2022-01-24 ENCOUNTER — Encounter (HOSPITAL_BASED_OUTPATIENT_CLINIC_OR_DEPARTMENT_OTHER): Payer: Self-pay | Admitting: Nurse Practitioner

## 2022-01-24 ENCOUNTER — Ambulatory Visit (HOSPITAL_BASED_OUTPATIENT_CLINIC_OR_DEPARTMENT_OTHER): Payer: PPO | Admitting: Nurse Practitioner

## 2022-01-24 VITALS — BP 142/90 | HR 90 | Ht 71.0 in | Wt 159.9 lb

## 2022-01-24 DIAGNOSIS — I4821 Permanent atrial fibrillation: Secondary | ICD-10-CM

## 2022-01-24 DIAGNOSIS — I10 Essential (primary) hypertension: Secondary | ICD-10-CM

## 2022-01-24 DIAGNOSIS — I5022 Chronic systolic (congestive) heart failure: Secondary | ICD-10-CM

## 2022-01-24 DIAGNOSIS — Z7901 Long term (current) use of anticoagulants: Secondary | ICD-10-CM

## 2022-01-24 DIAGNOSIS — I428 Other cardiomyopathies: Secondary | ICD-10-CM

## 2022-01-24 DIAGNOSIS — E7800 Pure hypercholesterolemia, unspecified: Secondary | ICD-10-CM

## 2022-01-24 NOTE — Progress Notes (Signed)
Cardiology Office Note:    Date:  01/24/2022   ID:  Austin Wong, DOB [birth year], MRN [id number]  PCP:  [name], NP   [facility] Providers Cardiologist:  [name], MD     Referring MD: [name], NP   Chief Complaint: follow-up a fib  History of Present Illness:    Austin Wong is a very pleasant 81 y.o. male with a hx of atrial fibrillation (presented with RVR), chronic combined CHF/NICM,   He presented 08/2017 with several month history of dyspnea on exertion.  He presented with A-fib RVR and was found to have slightly elevated troponins, elevated BNP, and bilateral pleural effusion.  During hospitalization he had an echo which showed reduced ejection fraction.  Left and right heart cath showed elevated right-sided filling pressures and minimal diffuse distal CAD not amendable to intervention.  He was diuresed and started on medication.  Dr. Harrell Gave had a lengthy and extensive conversation with him regarding guideline directed medical therapy.  He had strong beliefs regarding medications and supplements to manage his health.  He declined TEE/cardioversion, aspirin, statin, or Entresto.  At last cardiology clinic visit on 01/17/21 with Dr. Harrell Gave, he reported some dyspnea on exertion.  Able to push mower for a couple of passes before resting.  He notes he can recuperate quickly.  Also gets short of breath when walking from the parking lot to a store depending on the distance. For activity, he is often working on his car. Following intense activity he will develop myalgias worse in bilateral upper extremities. PCP had recently reduced antihypertensive by half due to low blood pressure readings. He denies lightheadedness. Home BP 107-108/76.  He had not monitored BP since medication was decreased. No changes were made to treatment regimen and 1 year follow-up was advised.  Today, he is here for annual follow-up. Reports he sometimes has shortness of breath  with walking but this has been present since onset a fib. Actually seems better recently. No chest pain, palpitations, orthopnea, PND, presyncope, syncope. Does not get regular exercise, however he remains active at home with yard work (some push mowing), keeping an old car running, and parks far from doors when he does his shopping so he can get extra steps. Home BP 120s over 70s - always high at provider's office.  Is aware that cholesterol is high but refuses to take any cholesterol-lowering medication.  Past Medical History:  Diagnosis Date   Arthritis    Atrial fibrillation ([place])    History of high blood pressure     Past Surgical History:  Procedure Laterality Date   INGUINAL HERNIA REPAIR     bilateral   RIGHT/LEFT HEART CATH AND CORONARY ANGIOGRAPHY N/A 09/24/2017   Procedure: RIGHT/LEFT HEART CATH AND CORONARY ANGIOGRAPHY;  Surgeon: Leonie Man, MD;  Location: [facility];  Service: Cardiovascular;  Laterality: N/A;   TONSILLECTOMY      Current Medications: Current Meds  Medication Sig   apixaban (ELIQUIS) 5 MG TABS tablet Take 1 tablet (5 mg total) by mouth 2 (two) times daily.   B Complex Vitamins (VITAMIN B COMPLEX PO) Take 1 tablet by mouth daily.   carvedilol (COREG) 6.25 MG tablet TAKE 1 TABLET BY MOUTH TWICE DAILY WITH A MEAL   Cholecalciferol (VITAMIN D3) 125 MCG/ML LIQD Place 4 drops under the tongue daily.   Coenzyme Q10 (COQ10) 100 MG CAPS Take 1 tablet by mouth every other day.   Lactobacillus (PROBIOTIC ACIDOPHILUS PO) Take  1 tablet by mouth daily.   lisinopril (ZESTRIL) 2.5 MG tablet TAKE ONE TABLET BY MOUTH EVERY DAY   NON FORMULARY Take 2 tablets by mouth 2 (two) times daily. Bone Up  with K2 (MK-7) Calcium   Omega-3 Fatty Acids (FISH OIL CONCENTRATE PO) Take 1,600 mg by mouth daily in the afternoon.   vitamin C (ASCORBIC ACID) 500 MG tablet Take 500 mg by mouth 2 (two) times daily.     Allergies:   Metoprolol tartrate   Social History    Socioeconomic History   Marital status: Widowed    Spouse name: Not on file   Number of children: Not on file   Years of education: Not on file   Highest education level: Not on file  Occupational History   Not on file  Tobacco Use   Smoking status: Never   Smokeless tobacco: Never  Vaping Use   Vaping Use: Never used  Substance and Sexual Activity   Alcohol use: Yes    Alcohol/week: 1.0 standard drink of alcohol    Types: 1 Cans of beer per week    Comment: Occasonially    Drug use: Never   Sexual activity: Not Currently  Other Topics Concern   Not on file  Social History Narrative   Diet: Blank      Do you drink/ eat things with caffeine? Yes      Marital status:  Widowed                             What year were you married ? Blank      Do you live in a house, apartment,assistred living, condo, trailer, etc.)? No      Is it one or more stories? Blank      How many persons live in your home ? Just Me      Do you have any pets in your home ?(please list) No      Highest Level of education completed: High School      Current or past profession: Machinist       Do you exercise?      Blank                        Type & how often  Work in the yard      ADVANCED DIRECTIVES (Please bring copies)      Do you have a living will? Yes      Do you have a DNR form?   Yes                    If not, do you want to discuss one?       Do you have signed POA?HPOA forms?  Yes                 If so, please bring to your appointment      FUNCTIONAL STATUS- To be completed by Spouse / child / Staff       Do you have difficulty bathing or dressing yourself ?  No      Do you have difficulty preparing food or eating ?  No      Do you have difficulty managing your mediation ?  No      Do you have difficulty managing your finances ?  No      Do you have difficulty affording your medication ?  No      Social Determinants of Radio broadcast assistant Strain: Not on file   Food Insecurity: Not on file  Transportation Needs: Not on file  Physical Activity: Not on file  Stress: Not on file  Social Connections: Not on file     Family History: The patient's family history includes CAD in his father; Heart attack (age of onset: 83) in his father; Hyperlipidemia in his father.  ROS:   Please see the history of present illness.    + chronic dyspnea All other systems reviewed and are negative.  Labs/Other Studies Reviewed:    The following studies were reviewed today:  [facility] 09/24/17 There is severe left ventricular systolic dysfunction. The left ventricular ejection fraction is less than 25% by visual estimate. LV end diastolic pressure is moderately elevated. 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 There is mild (2+) mitral regurgitation. --------------------------CORONARY ANATOMY-------------------------------- Colon Flattery 1st Diag lesion is 90% stenosed. Ost 2nd Diag lesion is 85% stenosed. Ost 2nd Diag to 2nd Diag lesion is 70% stenosed. Dist Cx lesion is 75% stenosed. Prox LAD to Mid LAD lesion is 20% stenosed.   Angiographically minimal CAD involving small Diag/Ramus branches and apical Lateral Cx-OM Severely dilated RA and RV with only mildly elevated pulmonary pressures. Severely dilated left ventricle with severe LV dysfunction (EF estimated may be 15 to 20%, but only mildly reduced cardiac output of 4.56 by FICK   Echo 12/02/17 Left ventricle: Wall thickness was increased in a pattern of    severe LVH. Systolic function was moderately to severely reduced.    The estimated ejection fraction was in the range of 30% to 35%.    Diffuse hypokinesis.  - Aortic valve: There was mild regurgitation.  - Left atrium: The atrium was mildly dilated.  - Right ventricle: Systolic function was moderately reduced.  - Right atrium: The atrium was mildly to moderately dilated.   Recent Labs: 04/03/2021: ALT 17; BUN 23; Creat 1.05;  Hemoglobin 14.4; Platelets 137; Potassium 4.0; Sodium 138  Recent Lipid Panel    Component Value Date/Time   CHOL 166 04/03/2021 0845   CHOL 176 02/05/2020 1212   TRIG 68 04/03/2021 0845   HDL 43 04/03/2021 0845   HDL 48 02/05/2020 1212   CHOLHDL 3.9 04/03/2021 0845   LDLCALC 108 (H) 04/03/2021 0845     Risk Assessment/Calculations:      Physical Exam:    VS:  BP (!) 142/90   Pulse 90   Ht '5\' 11"'$  (1.803 m)   Wt 159 lb 14.4 oz (72.5 kg)   BMI 22.30 kg/m     Wt Readings from Last 3 Encounters:  01/24/22 159 lb 14.4 oz (72.5 kg)  10/19/21 154 lb 12.8 oz (70.2 kg)  04/06/21 154 lb (69.9 kg)     GEN:  Well nourished, well developed in no acute distress HEENT: Normal NECK: No JVD; No carotid bruits CARDIAC: Irregular RR, no murmurs, rubs, gallops RESPIRATORY:  Clear to auscultation without rales, wheezing or rhonchi  ABDOMEN: Soft, non-tender, non-distended MUSCULOSKELETAL:  Mild bilateral LE edema; No deformity. 2+ pedal pulses, equal bilaterally SKIN: Warm and dry NEUROLOGIC:  Alert and oriented x 3 PSYCHIATRIC:  Normal affect   EKG:  EKG is ordered today.  The ekg ordered today demonstrates atrial fibrillation at 90 bpm  HYPERTENSION CONTROL Vitals:   01/24/22 1116 01/24/22 1123 01/24/22 1311  BP: (!) 154/100 (!) 156/108 (!) 142/90    The patient's blood pressure is elevated above  target today.  In order to address the patient's elevated BP: The blood pressure is usually elevated in clinic.  Blood pressures monitored at home have been optimal.     Diagnoses:    1. Permanent atrial fibrillation ([place])   2. Chronic systolic heart failure ([place])   3. Nonischemic cardiomyopathy ([place])   4. Essential hypertension   5. Pure hypercholesterolemia   6. Chronic anticoagulation    Assessment and Plan:     Atrial fibrillation on chronic anticoagulation: EKG reveals a fib. Heart rate is well-controlled. No bleeding problems on Eliquis. Will check bmet today to ensure  that Eliquis is appropriately dosed.  Continue carvedilol, Eliquis.  NICM/Chronic HFrEF: LVEF 30 to 35% on echo 11/2017. Previous lengthy discussion with primary cardiologist regarding GDMT. Has elected to treat with lisinopril and carvedilol.  Has mild LE edema present today, he states he does not note swelling at home. Feels that dyspnea is chronic and stable since hospitalization in 2019.  No orthopnea, PND. Weight is stable.  Hypertension: White coat hypertension documented at previous visits. BP did improve slightly on my recheck. Reports home BP is very well-controlled.   Hyperlipidemia: LDL 108 on 04/03/21. He refuses to take cholesterol-lowering agents.      Disposition: 1 year with Dr. Harrell Gave  Medication Adjustments/Labs and Tests Ordered: Current medicines are reviewed at length with the patient today.  Concerns regarding medicines are outlined above.  Orders Placed This Encounter  Procedures   Basic Metabolic Panel (BMET)   EKG 12-Lead   No orders of the defined types were placed in this encounter.   Patient Instructions  Medication Instructions:    Your physician recommends that you continue on your current medications as directed. Please refer to the Current Medication list given to you today.  *If you need a refill on your cardiac medications before your next appointment, please call your pharmacy*   Lab Work:  TODAY!!! BMET  If you have labs (blood work) drawn today and your tests are completely normal, you will receive your results only by: [place] (if you have MyChart) OR A paper copy in the mail If you have any lab test that is abnormal or we need to change your treatment, we will call you to review the results.   Testing/Procedures:   None ordered.   Follow-Up: At [facility], you and your health needs are our priority.  As part of our continuing mission to provide you with exceptional heart care, we have created designated  Provider Care Teams.  These Care Teams include your primary Cardiologist (physician) and Advanced Practice Providers (APPs -  Physician Assistants and Nurse Practitioners) who all work together to provide you with the care you need, when you need it.  We recommend signing up for the patient portal called "MyChart".  Sign up information is provided on this After Visit Summary.  MyChart is used to connect with patients for Virtual Visits (Telemedicine).  Patients are able to view lab/test results, encounter notes, upcoming appointments, etc.  Non-urgent messages can be sent to your provider as well.   To learn more about what you can do with MyChart, go to [email or web address].    Your next appointment:   1 year(s)  The format for your next appointment:   In Person  Provider:   [name], MD    Other Instructions  Your physician wants you to follow-up in: 1 year with Dr. Harrell Gave. You will receive a reminder letter in the mail two  months in advance. If you don't receive a letter, please call our office to schedule the follow-up appointment.   Important Information About Sugar         Signed, Emmaline Life, NP  01/24/2022 1:22 PM    [place]

## 2022-01-24 NOTE — Patient Instructions (Signed)
Medication Instructions:    Your physician recommends that you continue on your current medications as directed. Please refer to the Current Medication list given to you today.  *If you need a refill on your cardiac medications before your next appointment, please call your pharmacy*   Lab Work:  TODAY!!! BMET  If you have labs (blood work) drawn today and your tests are completely normal, you will receive your results only by: [place] (if you have MyChart) OR A paper copy in the mail If you have any lab test that is abnormal or we need to change your treatment, we will call you to review the results.   Testing/Procedures:   None ordered.   Follow-Up: At [facility], you and your health needs are our priority.  As part of our continuing mission to provide you with exceptional heart care, we have created designated Provider Care Teams.  These Care Teams include your primary Cardiologist (physician) and Advanced Practice Providers (APPs -  Physician Assistants and Nurse Practitioners) who all work together to provide you with the care you need, when you need it.  We recommend signing up for the patient portal called "MyChart".  Sign up information is provided on this After Visit Summary.  MyChart is used to connect with patients for Virtual Visits (Telemedicine).  Patients are able to view lab/test results, encounter notes, upcoming appointments, etc.  Non-urgent messages can be sent to your provider as well.   To learn more about what you can do with MyChart, go to [email or web address].    Your next appointment:   1 year(s)  The format for your next appointment:   In Person  Provider:   [name], MD    Other Instructions  Your physician wants you to follow-up in: 1 year with Dr. Harrell Gave. You will receive a reminder letter in the mail two months in advance. If you don't receive a letter, please call our office to schedule the follow-up  appointment.   Important Information About Sugar

## 2022-01-25 ENCOUNTER — Telehealth (HOSPITAL_BASED_OUTPATIENT_CLINIC_OR_DEPARTMENT_OTHER): Payer: Self-pay | Admitting: *Deleted

## 2022-01-25 DIAGNOSIS — I4821 Permanent atrial fibrillation: Secondary | ICD-10-CM

## 2022-01-25 LAB — BASIC METABOLIC PANEL
BUN/Creatinine Ratio: 15 (ref 10–24)
BUN: 15 mg/dL (ref 8–27)
CO2: 26 mmol/L (ref 20–29)
Calcium: 10.3 mg/dL — ABNORMAL HIGH (ref 8.6–10.2)
Chloride: 98 mmol/L (ref 96–106)
Creatinine, Ser: 1.02 mg/dL (ref 0.76–1.27)
Glucose: 141 mg/dL — ABNORMAL HIGH (ref 70–99)
Potassium: 4.3 mmol/L (ref 3.5–5.2)
Sodium: 139 mmol/L (ref 134–144)
eGFR: 73 mL/min/{1.73_m2} (ref >59)

## 2022-01-25 MED ORDER — APIXABAN 5 MG PO TABS: 5.0000 mg | ORAL_TABLET | Freq: Two times a day (BID) | ORAL | 3 refills | Status: DC

## 2022-01-25 NOTE — Telephone Encounter (Signed)
Patient assistance papers waiting be signed by Dr Harrell Gave  Will fax once signed

## 2022-01-26 NOTE — Telephone Encounter (Signed)
Faxed

## 2022-05-07 ENCOUNTER — Other Ambulatory Visit: Payer: Self-pay | Admitting: *Deleted

## 2022-05-07 DIAGNOSIS — I428 Other cardiomyopathies: Secondary | ICD-10-CM

## 2022-05-07 DIAGNOSIS — I5022 Chronic systolic (congestive) heart failure: Secondary | ICD-10-CM

## 2022-05-07 DIAGNOSIS — I4821 Permanent atrial fibrillation: Secondary | ICD-10-CM

## 2022-05-07 MED ORDER — APIXABAN 5 MG PO TABS: 5.0000 mg | ORAL_TABLET | Freq: Two times a day (BID) | ORAL | 1 refills | Status: DC

## 2022-05-07 MED ORDER — CARVEDILOL 6.25 MG PO TABS: 6.2500 mg | ORAL_TABLET | Freq: Two times a day (BID) | ORAL | 2 refills | Status: DC

## 2022-05-07 MED ORDER — LISINOPRIL 2.5 MG PO TABS: 2.5000 mg | ORAL_TABLET | Freq: Every day | ORAL | 2 refills | Status: DC

## 2022-05-07 NOTE — Telephone Encounter (Signed)
Prescription refill request for Eliquis received. Indication: a fib Last office visit: 01/24/22 Scr: 1.02 01/24/22 epic Age: 82 Weight: 72kg

## 2022-05-31 ENCOUNTER — Ambulatory Visit (INDEPENDENT_AMBULATORY_CARE_PROVIDER_SITE_OTHER): Payer: PPO | Admitting: Adult Health

## 2022-05-31 ENCOUNTER — Encounter: Payer: Self-pay | Admitting: Adult Health

## 2022-05-31 DIAGNOSIS — Z0000 Encounter for general adult medical examination without abnormal findings: Secondary | ICD-10-CM

## 2022-05-31 NOTE — Progress Notes (Signed)
This service is provided via telemedicine  No vital signs collected/recorded due to the encounter was a telemedicine visit.   Location of patient (ex: home, work):  Home  Patient consents to a telephone visit:    Location of the provider (ex: office, home):  Office  Name of any referring provider:  [name], NP   Names of all persons participating in the telemedicine service and their role in the encounter:  Austin Wong (patient); Porsha MccCurkin,CMA; Davidjames Blansett Medina-Vargas,NP  Time spent on call:  10 minutes    This is a telephone visit.  Provider location:  [facility]  Patient location:  Home     Subjective:   Austin Wong is a 82 y.o. male who presents for Medicare Annual/Subsequent preventive examination.  Review of Systems     Cardiac Risk Factors include: advanced age (>85men, >33 women);male gender     Objective:    There were no vitals filed for this visit. There is no height or weight on file to calculate BMI.     05/31/2022    9:52 AM 04/06/2021    9:43 AM 05/20/2020   11:03 AM 04/22/2020    1:41 PM 04/10/2018    3:50 PM 09/22/2017   12:00 AM  Advanced Directives  Does Patient Have a Medical Advance Directive? Yes Yes Yes Yes Yes Yes  Type of Paramedic of [place];Living will [place];Living will [place];Living will;Out of facility DNR (pink MOST or yellow form) [place];Living will;Out of facility DNR (pink MOST or yellow form) Living will Living will  Does patient want to make changes to medical advance directive? No - Patient declined No - Patient declined No - Patient declined No - Patient declined No - Patient declined No - Patient declined  Copy of [place] in Chart? Yes - validated most recent copy scanned in chart (See row information) Yes - validated most recent copy scanned in chart (See row information) Yes - validated most recent copy  scanned in chart (See row information) Yes - validated most recent copy scanned in chart (See row information)      Current Medications (verified) Outpatient Encounter Medications as of 05/31/2022  Medication Sig   apixaban (ELIQUIS) 5 MG TABS tablet Take 1 tablet (5 mg total) by mouth 2 (two) times daily.   B Complex Vitamins (VITAMIN B COMPLEX PO) Take 1 tablet by mouth daily.   carvedilol (COREG) 6.25 MG tablet Take 1 tablet (6.25 mg total) by mouth 2 (two) times daily with a meal.   Cholecalciferol (VITAMIN D3) 125 MCG/ML LIQD Place 4 drops under the tongue daily.   Coenzyme Q10 (COQ10) 100 MG CAPS Take 1 tablet by mouth every other day.   Lactobacillus (PROBIOTIC ACIDOPHILUS PO) Take 1 tablet by mouth daily.   lisinopril (ZESTRIL) 2.5 MG tablet Take 1 tablet (2.5 mg total) by mouth daily.   Omega-3 Fatty Acids (FISH OIL CONCENTRATE PO) Take 1,600 mg by mouth daily in the afternoon.   vitamin C (ASCORBIC ACID) 500 MG tablet Take 500 mg by mouth 2 (two) times daily.   [DISCONTINUED] NON FORMULARY Take 2 tablets by mouth 2 (two) times daily. Bone Up  with K2 (MK-7) Calcium   No facility-administered encounter medications on file as of 05/31/2022.    Allergies (verified) Metoprolol tartrate   History: Past Medical History:  Diagnosis Date   Arthritis    Atrial fibrillation    History of  high blood pressure    Past Surgical History:  Procedure Laterality Date   INGUINAL HERNIA REPAIR     bilateral   RIGHT/LEFT HEART CATH AND CORONARY ANGIOGRAPHY N/A 09/24/2017   Procedure: RIGHT/LEFT HEART CATH AND CORONARY ANGIOGRAPHY;  Surgeon: Leonie Man, MD;  Location: [facility];  Service: Cardiovascular;  Laterality: N/A;   TONSILLECTOMY     Family History  Problem Relation Age of Onset   CAD Father    Hyperlipidemia Father    Heart attack Father 40   Social History   Socioeconomic History   Marital status: Widowed    Spouse name: Not on file   Number of children: Not  on file   Years of education: Not on file   Highest education level: Not on file  Occupational History   Not on file  Tobacco Use   Smoking status: Never   Smokeless tobacco: Never  Vaping Use   Vaping Use: Never used  Substance and Sexual Activity   Alcohol use: Yes    Alcohol/week: 1.0 standard drink of alcohol    Types: 1 Cans of beer per week    Comment: Occasonially    Drug use: Never   Sexual activity: Not Currently  Other Topics Concern   Not on file  Social History Narrative   Diet: Blank      Do you drink/ eat things with caffeine? Yes      Marital status:  Widowed                             What year were you married ? Blank      Do you live in a house, apartment,assistred living, condo, trailer, etc.)? No      Is it one or more stories? Blank      How many persons live in your home ? Just Me      Do you have any pets in your home ?(please list) No      Highest Level of education completed: High School      Current or past profession: Machinist       Do you exercise?      Blank                        Type & how often  Work in the yard      ADVANCED DIRECTIVES (Please bring copies)      Do you have a living will? Yes      Do you have a DNR form?   Yes                    If not, do you want to discuss one?       Do you have signed POA?HPOA forms?  Yes                 If so, please bring to your appointment      FUNCTIONAL STATUS- To be completed by Spouse / child / Staff       Do you have difficulty bathing or dressing yourself ?  No      Do you have difficulty preparing food or eating ?  No      Do you have difficulty managing your mediation ?  No      Do you have difficulty managing your finances ?  No  Do you have difficulty affording your medication ?  No      Social Determinants of Radio broadcast assistant Strain: Not on file  Food Insecurity: Not on file  Transportation Needs: Not on file  Physical Activity: Not on file  Stress:  Not on file  Social Connections: Not on file    Tobacco Counseling Counseling given: Not Answered   Clinical Intake:  Pre-visit preparation completed: Yes  Pain : No/denies pain     BMI - recorded: 21.59 Nutritional Status: BMI of 19-24  Normal Nutritional Risks: None Diabetes: No  How often do you need to have someone help you when you read instructions, pamphlets, or other written materials from your doctor or pharmacy?: 1 - Never What is the last grade level you completed in school?: 12th grade  Diabetic?no  Interpreter Needed?: No  Information entered by :: Porsha McClurkin,CMA   Activities of Daily Living    05/31/2022    9:57 AM  In your present state of health, do you have any difficulty performing the following activities:  Hearing? 1  Comment Wear hearing aids  Vision? 0  Difficulty concentrating or making decisions? 0  Walking or climbing stairs? 0  Dressing or bathing? 0  Doing errands, shopping? 0  Preparing Food and eating ? N  Using the Toilet? N  In the past six months, have you accidently leaked urine? N  Do you have problems with loss of bowel control? N  Managing your Medications? N  Managing your Finances? N  Housekeeping or managing your Housekeeping? N    Patient Care Team: [name], NP as PCP - General (Adult Health Nurse Practitioner) [name], MD as PCP - Cardiology (Cardiology)  Indicate any recent Medical Services you may have received from other than [facility] providers in the past year (date may be approximate).     Assessment:   This is a routine wellness examination for Austin Wong.  Hearing/Vision screen Hearing Screening - Comments:: Wear hearing aids/ no concerns Vision Screening - Comments:: Wear glasses/ no concerns  Dietary issues and exercise activities discussed: Current Exercise Habits: Home exercise routine, Type of exercise: walking;Other - see comments (Yard work), Time (Minutes): 30, Frequency  (Times/Week): 7, Weekly Exercise (Minutes/Week): 210, Intensity: Mild   Goals Addressed   None    Depression Screen    05/31/2022    9:57 AM 04/06/2021   10:54 AM 04/22/2020    1:43 PM 03/06/2018    3:06 PM  PHQ 2/9 Scores  PHQ - 2 Score 0 0 0 0    Fall Risk    05/31/2022    9:57 AM 04/06/2021   10:54 AM 12/01/2020   11:21 AM 04/22/2020    1:41 PM  Fall Risk   Falls in the past year? 0 1 0 0  Number falls in past yr: 0 0 0 0  Injury with Fall? 0 0 0 0  Risk for fall due to : No Fall Risks No Fall Risks No Fall Risks   Follow up Falls evaluation completed Falls evaluation completed Falls evaluation completed;Education provided;Falls prevention discussed     FALL RISK PREVENTION PERTAINING TO THE HOME:  Any stairs in or around the home? No  If so, are there any without handrails? No  Home free of loose throw rugs in walkways, pet beds, electrical cords, etc? Yes  Adequate lighting in your home to reduce risk of falls? Yes   ASSISTIVE DEVICES UTILIZED TO PREVENT FALLS:  Life  alert? No  Use of a cane, walker or w/c? No  Grab bars in the bathroom? No  Shower chair or bench in shower? No  Elevated toilet seat or a handicapped toilet? No   TIMED UP AND GO:  Was the test performed? No .  Length of time to ambulate 10 feet: N/A sec.   Gait steady and fast without use of assistive device  Cognitive Function:    05/20/2020   12:09 PM  MMSE - Mini Mental State Exam  Orientation to time 4  Orientation to Place 3  Registration 3  Attention/ Calculation 0  Recall 3  Language- name 2 objects 2  Language- repeat 1  Language- follow 3 step command 3  Language- read & follow direction 1  Write a sentence 1  Copy design 1  Total score 22        05/31/2022    9:54 AM  6CIT Screen  What Year? 0 points  What month? 0 points  What time? 0 points  Count back from 20 0 points  Months in reverse 4 points  Repeat phrase 0 points  Total Score 4 points    Immunizations  There  is no immunization history on file for this patient.  TDAP status: Due, Education has been provided regarding the importance of this vaccine. Advised may receive this vaccine at local pharmacy or Health Dept. Aware to provide a copy of the vaccination record if obtained from local pharmacy or Health Dept. Verbalized acceptance and understanding.  Flu Vaccine status: Declined, Education has been provided regarding the importance of this vaccine but patient still declined. Advised may receive this vaccine at local pharmacy or Health Dept. Aware to provide a copy of the vaccination record if obtained from local pharmacy or Health Dept. Verbalized acceptance and understanding.  Pneumococcal vaccine status: Declined,  Education has been provided regarding the importance of this vaccine but patient still declined. Advised may receive this vaccine at local pharmacy or Health Dept. Aware to provide a copy of the vaccination record if obtained from local pharmacy or Health Dept. Verbalized acceptance and understanding.   Covid-19 vaccine status: Declined, Education has been provided regarding the importance of this vaccine but patient still declined. Advised may receive this vaccine at local pharmacy or Health Dept.or vaccine clinic. Aware to provide a copy of the vaccination record if obtained from local pharmacy or Health Dept. Verbalized acceptance and understanding.  Qualifies for Shingles Vaccine? No   Zostavax completed No   Shingrix Completed?: No.    Education has been provided regarding the importance of this vaccine. Patient has been advised to call insurance company to determine out of pocket expense if they have not yet received this vaccine. Advised may also receive vaccine at local pharmacy or Health Dept. Verbalized acceptance and understanding.  Screening Tests Health Maintenance  Topic Date Due   Medicare Annual Wellness (AWV)  Never done   DTaP/Tdap/Td (1 - Tdap) Never done   INFLUENZA  VACCINE  09/27/2022   HPV VACCINES  Aged Out   Pneumonia Vaccine 51+ Years old  Discontinued   COVID-19 Vaccine  Discontinued   Zoster Vaccines- Shingrix  Discontinued    Health Maintenance  Health Maintenance Due  Topic Date Due   Medicare Annual Wellness (AWV)  Never done   DTaP/Tdap/Td (1 - Tdap) Never done    Colorectal cancer screening: Type of screening: Cologuard. Completed .3 > years ago. Repeat every 3 years  Lung Cancer Screening: (Low Dose  CT Chest recommended if Age 82-80 years, 30 pack-year currently smoking OR have quit w/in 15years.) does not qualify.   Lung Cancer Screening Referral: N/A  Additional Screening:  Hepatitis C Screening: does not qualify; Completed N/A  Vision Screening: Recommended annual ophthalmology exams for early detection of glaucoma and other disorders of the eye. Is the patient up to date with their annual eye exam?  No  Who is the provider or what is the name of the office in which the patient attends annual eye exams? None If pt is not established with a provider, would they like to be referred to a provider to establish care? No .   Dental Screening: Recommended annual dental exams for proper oral hygiene  Community Resource Referral / Chronic Care Management: CRR required this visit?  No   CCM required this visit?  No      Plan:     I have personally reviewed and noted the following in the patient's chart:   Medical and social history Use of alcohol, tobacco or illicit drugs  Current medications and supplements including opioid prescriptions. Patient is not currently taking opioid prescriptions. Functional ability and status Nutritional status Physical activity Advanced directives List of other physicians Hospitalizations, surgeries, and ER visits in previous 12 months Vitals Screenings to include cognitive, depression, and falls Referrals and appointments  In addition, I have reviewed and discussed with patient  certain preventive protocols, quality metrics, and best practice recommendations. A written personalized care plan for preventive services as well as general preventive health recommendations were provided to patient.     Aella Ronda Medina-Vargas, NP   05/31/2022   Nurse Notes: Needs to be done annually.

## 2022-07-24 ENCOUNTER — Encounter: Payer: Self-pay | Admitting: *Deleted

## 2022-07-24 NOTE — Progress Notes (Signed)
[facility] Quality Team Note  Name: Austin Wong Date of Birth: [birth year] MRN: [id number] Date: 07/24/2022  [facility] Quality Team has reviewed this patient's chart, please see recommendations below:  [facility] Quality Other; Pt has open gap for blood pressure.  Called pt to see if I could arrange follow up.  Pt declined making appt.  Routing as FYI.  Pt would need done before end of 2024 to close gap.

## 2022-10-25 ENCOUNTER — Encounter: Payer: Self-pay | Admitting: Orthopedic Surgery

## 2022-10-25 ENCOUNTER — Ambulatory Visit (INDEPENDENT_AMBULATORY_CARE_PROVIDER_SITE_OTHER): Payer: PPO | Admitting: Orthopedic Surgery

## 2022-10-25 VITALS — BP 142/80 | HR 94 | Temp 97.3°F | Resp 16 | Ht 71.0 in | Wt 155.8 lb

## 2022-10-25 DIAGNOSIS — I4821 Permanent atrial fibrillation: Secondary | ICD-10-CM

## 2022-10-25 DIAGNOSIS — R413 Other amnesia: Secondary | ICD-10-CM

## 2022-10-25 DIAGNOSIS — Z125 Encounter for screening for malignant neoplasm of prostate: Secondary | ICD-10-CM | POA: Diagnosis not present

## 2022-10-25 DIAGNOSIS — I428 Other cardiomyopathies: Secondary | ICD-10-CM

## 2022-10-25 DIAGNOSIS — I5022 Chronic systolic (congestive) heart failure: Secondary | ICD-10-CM

## 2022-10-25 DIAGNOSIS — D692 Other nonthrombocytopenic purpura: Secondary | ICD-10-CM

## 2022-10-25 DIAGNOSIS — I10 Essential (primary) hypertension: Secondary | ICD-10-CM | POA: Diagnosis not present

## 2022-10-25 MED ORDER — CARVEDILOL 6.25 MG PO TABS: 6.2500 mg | ORAL_TABLET | Freq: Two times a day (BID) | ORAL | 0 refills | Status: DC

## 2022-10-25 MED ORDER — LISINOPRIL 2.5 MG PO TABS: 2.5000 mg | ORAL_TABLET | Freq: Every day | ORAL | 0 refills | Status: DC

## 2022-10-25 MED ORDER — APIXABAN 5 MG PO TABS: 5.0000 mg | ORAL_TABLET | Freq: Two times a day (BID) | ORAL | 0 refills | Status: DC

## 2022-10-25 NOTE — Progress Notes (Signed)
Careteam: Patient Care Team: Austin Heir, NP as PCP - General (Adult Health Nurse Practitioner) Jodelle Red, MD as PCP - Cardiology (Cardiology)  Seen by: Hazle Nordmann, AGNP-C  PLACE OF SERVICE:  [facility]  Advanced Directive information Does Patient Have a Medical Advance Directive?: No, Would patient like information on creating a medical advance directive?: No - Patient declined  Allergies  Allergen Reactions   Metoprolol Tartrate Nausea Only    No chief complaint on file.    HPI: Patient is a 82 y.o. male seen today foe medical management of chronic conditions.   He is not fasting for lab work today. Initially refused lab work. Explained basic blood work had not been done in over 1 year. He agreed after explanation was given.   H/o CHF, nonischemic cardiomyopathy, PAF and HTN. Followed by Dr. Cristal Deer. He continues to have DOE, but denies progression. 11/2017 LVEF 30-35%. Antihypertensives were reduced last year by PCP due to low blood pressures. He reports checking pressures at home and they average < 140/90 and above 100/80. Denies chest pain, sob, dizziness or headaches today. Remains on lisinopril, carvedilol and Eliquis. Asking for 90 day supple refill today.   Continues to have nocturia. Averaging about 3x/night. He is not taking saw palmetto anymore. He does refrain from drinking fluids after dinner. Denies dysuria or hematuria.   05/2022 MMSE 22/30. Admits to feeling forgetful at times. He did not recall last time he had been to this office or seen cardiologist today.   Weights stable.   No recent falls or injuries.   Refused flu vaccine.     Review of Systems:  Review of Systems  Constitutional:  Negative for malaise/fatigue.  HENT:  Negative for congestion and sore throat.   Eyes:  Negative for redness.  Respiratory:  Negative for cough, shortness of breath and wheezing.   Cardiovascular:  Negative for chest pain and leg swelling.   Gastrointestinal:  Negative for blood in stool, constipation and diarrhea.  Genitourinary:  Negative for dysuria, frequency and hematuria.       Nocturia  Musculoskeletal:  Positive for joint pain. Negative for falls.  Skin:        bruise  Neurological:  Negative for dizziness and headaches.  Endo/Heme/Allergies:  Negative for environmental allergies.  Psychiatric/Behavioral:  Negative for depression and memory loss. The patient is not nervous/anxious and does not have insomnia.     Past Medical History:  Diagnosis Date   Arthritis    Atrial fibrillation (HCC)    History of high blood pressure    Past Surgical History:  Procedure Laterality Date   INGUINAL HERNIA REPAIR     bilateral   RIGHT/LEFT HEART CATH AND CORONARY ANGIOGRAPHY N/A 09/24/2017   Procedure: RIGHT/LEFT HEART CATH AND CORONARY ANGIOGRAPHY;  Surgeon: Marykay Lex, MD;  Location: [facility];  Service: Cardiovascular;  Laterality: N/A;   TONSILLECTOMY     Social History:   reports that he has never smoked. He has never used smokeless tobacco. He reports current alcohol use of about 1.0 standard drink of alcohol per week. He reports that he does not use drugs.  Family History  Problem Relation Age of Onset   CAD Father    Hyperlipidemia Father    Heart attack Father 47    Medications: Patient's Medications  New Prescriptions   No medications on file  Previous Medications   APIXABAN (ELIQUIS) 5 MG TABS TABLET    Take 1 tablet (5  mg total) by mouth 2 (two) times daily.   B COMPLEX VITAMINS (VITAMIN B COMPLEX PO)    Take 1 tablet by mouth daily.   CARVEDILOL (COREG) 6.25 MG TABLET    Take 1 tablet (6.25 mg total) by mouth 2 (two) times daily with a meal.   CHOLECALCIFEROL (VITAMIN D3) 125 MCG/ML LIQD    Place 4 drops under the tongue daily.   COENZYME Q10 (COQ10) 100 MG CAPS    Take 1 tablet by mouth every other day.   LACTOBACILLUS (PROBIOTIC ACIDOPHILUS PO)    Take 1 tablet by mouth daily.    LISINOPRIL (ZESTRIL) 2.5 MG TABLET    Take 1 tablet (2.5 mg total) by mouth daily.   OMEGA-3 FATTY ACIDS (FISH OIL CONCENTRATE PO)    Take 1,600 mg by mouth daily in the afternoon.   VITAMIN C (ASCORBIC ACID) 500 MG TABLET    Take 500 mg by mouth 2 (two) times daily.  Modified Medications   No medications on file  Discontinued Medications   No medications on file    Physical Exam:  Vitals:   10/25/22 0952 10/25/22 1003  BP: (!) 140/110 (!) 140/110  Pulse: 94   Resp: 16   Temp: (!) 97.3 F (36.3 C)   SpO2: 99%   Weight: 155 lb 12.8 oz (70.7 kg)   Height: 5\' 11"  (1.803 m)    Body mass index is 21.73 kg/m. Wt Readings from Last 3 Encounters:  10/25/22 155 lb 12.8 oz (70.7 kg)  01/24/22 159 lb 14.4 oz (72.5 kg)  10/19/21 154 lb 12.8 oz (70.2 kg)    Physical Exam Vitals reviewed.  Constitutional:      General: He is not in acute distress. HENT:     Head: Normocephalic.     Right Ear: There is no impacted cerumen.     Left Ear: There is no impacted cerumen.     Ears:     Comments: Right hearing aid    Nose: Nose normal.     Mouth/Throat:     Mouth: Mucous membranes are moist.  Eyes:     General:        Right eye: No discharge.        Left eye: No discharge.  Cardiovascular:     Rate and Rhythm: Normal rate and regular rhythm.     Pulses: Normal pulses.     Heart sounds: Normal heart sounds.  Pulmonary:     Effort: Pulmonary effort is normal.     Breath sounds: Normal breath sounds.  Abdominal:     General: Bowel sounds are normal.     Palpations: Abdomen is soft.  Musculoskeletal:     Cervical back: Neck supple.     Right lower leg: No edema.     Left lower leg: No edema.  Skin:    General: Skin is warm.     Capillary Refill: Capillary refill takes less than 2 seconds.     Comments: Approx 1 cm nontender purple lesion to right anterior forearm, no skin breakdown  Neurological:     General: No focal deficit present.     Mental Status: He is alert and  oriented to person, place, and time.  Psychiatric:        Mood and Affect: Mood normal.        Behavior: Behavior normal.     Labs reviewed: Basic Metabolic Panel: Recent Labs    01/24/22 1206  NA 139  K 4.3  CL 98  CO2 26  GLUCOSE 141*  BUN 15  CREATININE 1.02  CALCIUM 10.3*   Liver Function Tests: No results for input(s): "AST", "ALT", "ALKPHOS", "BILITOT", "PROT", "ALBUMIN" in the last 8760 hours. No results for input(s): "LIPASE", "AMYLASE" in the last 8760 hours. No results for input(s): "AMMONIA" in the last 8760 hours. CBC: No results for input(s): "WBC", "NEUTROABS", "HGB", "HCT", "MCV", "PLT" in the last 8760 hours. Lipid Panel: No results for input(s): "CHOL", "HDL", "LDLCALC", "TRIG", "CHOLHDL", "LDLDIRECT" in the last 8760 hours. TSH: No results for input(s): "TSH" in the last 8760 hours. A1C: No results found for: "HGBA1C"   Assessment/Plan 1. Chronic systolic heart failure (HCC) - LVEF 30-35% 2019 - unchanged DOE - weight stable, no edema, lung sounds clear - lisinopril (ZESTRIL) 2.5 MG tablet; Take 1 tablet (2.5 mg total) by mouth daily.  Dispense: 90 tablet; Refill: 0 - carvedilol (COREG) 6.25 MG tablet; Take 1 tablet (6.25 mg total) by mouth 2 (two) times daily with a meal.  Dispense: 180 tablet; Refill: 0  2. Nonischemic cardiomyopathy (HCC) - see above - lisinopril (ZESTRIL) 2.5 MG tablet; Take 1 tablet (2.5 mg total) by mouth daily.  Dispense: 90 tablet; Refill: 0 - carvedilol (COREG) 6.25 MG tablet; Take 1 tablet (6.25 mg total) by mouth 2 (two) times daily with a meal.  Dispense: 180 tablet; Refill: 0  3. Permanent atrial fibrillation (HCC) - HR< 100 with carvedilol - cont Eliquis for clot prevention - apixaban (ELIQUIS) 5 MG TABS tablet; Take 1 tablet (5 mg total) by mouth 2 (two) times daily.  Dispense: 180 tablet; Refill: 0 - carvedilol (COREG) 6.25 MG tablet; Take 1 tablet (6.25 mg total) by mouth 2 (two) times daily with a meal.   Dispense: 180 tablet; Refill: 0  4. Essential hypertension - " whit coat syndrome" - reports controlled home pressures - cont lisinopril and carvedilol - CBC with Differential/Platelet - Complete Metabolic Panel with eGFR  5. Senile purpura (HCC) - education given  6. Prostate cancer screening - H/o BPH - continues to have nocturia - PSA, Total and Free  7. Memory changes - MMSE 22/30 05/2022   Total time: 34 minutes. Greater than 50% of total time spent doing patient education regarding health maintenance PAF, HTN, nocturia, BPH, and memory issues including symptom/medication management.     Next appt: Visit date not found  Pearly Apachito Scherry Ran  [facility] [phone]

## 2022-10-25 NOTE — Patient Instructions (Addendum)
Please make sure you schedule with Dr. Rexene Alberts this fall> [phone]

## 2022-10-30 LAB — CBC WITH DIFFERENTIAL/PLATELET
Absolute Monocytes: 559 {cells}/uL (ref 200–950)
Basophils Absolute: 68 {cells}/uL (ref 0–200)
Basophils Relative: 1.2 %
Eosinophils Absolute: 148 {cells}/uL (ref 15–500)
Eosinophils Relative: 2.6 %
HCT: 41.6 % (ref 38.5–50.0)
Hemoglobin: 13.9 g/dL (ref 13.2–17.1)
Lymphs Abs: 1220 {cells}/uL (ref 850–3900)
MCH: 31.2 pg (ref 27.0–33.0)
MCHC: 33.4 g/dL (ref 32.0–36.0)
MCV: 93.5 fL (ref 80.0–100.0)
MPV: 12 fL (ref 7.5–12.5)
Monocytes Relative: 9.8 %
Neutro Abs: 3705 {cells}/uL (ref 1500–7800)
Neutrophils Relative %: 65 %
Platelets: 148 10*3/uL (ref 140–400)
RBC: 4.45 10*6/uL (ref 4.20–5.80)
RDW: 12.9 % (ref 11.0–15.0)
Total Lymphocyte: 21.4 %
WBC: 5.7 10*3/uL (ref 3.8–10.8)

## 2022-10-30 LAB — PSA, TOTAL AND FREE
PSA, Free: 1.8 ng/mL
PSA, Total: 13.3 ng/mL — ABNORMAL HIGH (ref <=4.0)

## 2022-10-30 LAB — COMPLETE METABOLIC PANEL WITH GFR
AG Ratio: 1.7 (calc) (ref 1.0–2.5)
ALT: 14 U/L (ref 9–46)
AST: 22 U/L (ref 10–35)
Albumin: 4.2 g/dL (ref 3.6–5.1)
Alkaline phosphatase (APISO): 83 U/L (ref 35–144)
BUN: 14 mg/dL (ref 7–25)
CO2: 27 mmol/L (ref 20–32)
Calcium: 10 mg/dL (ref 8.6–10.3)
Chloride: 101 mmol/L (ref 98–110)
Creat: 0.96 mg/dL (ref 0.70–1.22)
Globulin: 2.5 g/dL (ref 1.9–3.7)
Glucose, Bld: 110 mg/dL — ABNORMAL HIGH (ref 65–99)
Potassium: 4.3 mmol/L (ref 3.5–5.3)
Sodium: 139 mmol/L (ref 135–146)
Total Bilirubin: 1.9 mg/dL — ABNORMAL HIGH (ref 0.2–1.2)
Total Protein: 6.7 g/dL (ref 6.1–8.1)
eGFR: 78 mL/min/{1.73_m2} (ref >=60)

## 2022-11-06 ENCOUNTER — Other Ambulatory Visit: Payer: Self-pay

## 2022-11-06 DIAGNOSIS — I5022 Chronic systolic (congestive) heart failure: Secondary | ICD-10-CM

## 2022-11-06 DIAGNOSIS — I428 Other cardiomyopathies: Secondary | ICD-10-CM

## 2022-11-06 DIAGNOSIS — I4821 Permanent atrial fibrillation: Secondary | ICD-10-CM

## 2022-11-06 MED ORDER — APIXABAN 5 MG PO TABS
5.0000 mg | ORAL_TABLET | Freq: Two times a day (BID) | ORAL | 0 refills | Status: DC
Start: 2022-11-06 — End: 2023-04-15

## 2022-11-06 MED ORDER — CARVEDILOL 6.25 MG PO TABS
6.2500 mg | ORAL_TABLET | Freq: Two times a day (BID) | ORAL | 0 refills | Status: DC
Start: 2022-11-06 — End: 2023-05-02

## 2022-11-06 MED ORDER — LISINOPRIL 2.5 MG PO TABS
2.5000 mg | ORAL_TABLET | Freq: Every day | ORAL | 0 refills | Status: DC
Start: 2022-11-06 — End: 2023-05-02

## 2022-11-06 NOTE — Telephone Encounter (Signed)
Patient is aware with Austin Wong's response

## 2022-11-06 NOTE — Telephone Encounter (Signed)
Patient returned call and stated his rx for carvedilol is suppose to be for once daily and not twice daily. Patient states Amy changed this.  Please advise

## 2022-11-06 NOTE — Telephone Encounter (Signed)
Carvedilol is twice daily. Blood pressure/Afib medications were not changed last encounter. He requested 90 day supply because of issues with [facility].

## 2022-11-06 NOTE — Telephone Encounter (Signed)
Outgoing call placed to patient to question refill request [facility] [place] on [address] as they are not on his active pharmacy list. Patient states [facility] is [facility] because [facility] said they are losing money on the Eliquis and was not able to dispense the medication to the patient. Patient states he wants all [facility]'s [facility] to [facility] in the future.  I called [facility] and requested that they VOID any pending refills for patient

## 2022-12-25 DIAGNOSIS — H3581 Retinal edema: Secondary | ICD-10-CM | POA: Diagnosis not present

## 2022-12-27 DIAGNOSIS — H35033 Hypertensive retinopathy, bilateral: Secondary | ICD-10-CM | POA: Diagnosis not present

## 2022-12-27 DIAGNOSIS — H34831 Tributary (branch) retinal vein occlusion, right eye, with macular edema: Secondary | ICD-10-CM | POA: Diagnosis not present

## 2022-12-27 DIAGNOSIS — H43813 Vitreous degeneration, bilateral: Secondary | ICD-10-CM | POA: Diagnosis not present

## 2022-12-27 DIAGNOSIS — H43393 Other vitreous opacities, bilateral: Secondary | ICD-10-CM | POA: Diagnosis not present

## 2022-12-27 DIAGNOSIS — H25813 Combined forms of age-related cataract, bilateral: Secondary | ICD-10-CM | POA: Diagnosis not present

## 2023-01-01 DIAGNOSIS — H2511 Age-related nuclear cataract, right eye: Secondary | ICD-10-CM | POA: Diagnosis not present

## 2023-01-01 DIAGNOSIS — Z01818 Encounter for other preprocedural examination: Secondary | ICD-10-CM | POA: Diagnosis not present

## 2023-01-01 DIAGNOSIS — H2513 Age-related nuclear cataract, bilateral: Secondary | ICD-10-CM | POA: Diagnosis not present

## 2023-01-01 DIAGNOSIS — H34831 Tributary (branch) retinal vein occlusion, right eye, with macular edema: Secondary | ICD-10-CM | POA: Diagnosis not present

## 2023-01-17 DIAGNOSIS — H2511 Age-related nuclear cataract, right eye: Secondary | ICD-10-CM | POA: Diagnosis not present

## 2023-01-31 DIAGNOSIS — H2512 Age-related nuclear cataract, left eye: Secondary | ICD-10-CM | POA: Diagnosis not present

## 2023-01-31 DIAGNOSIS — I10 Essential (primary) hypertension: Secondary | ICD-10-CM | POA: Diagnosis not present

## 2023-01-31 DIAGNOSIS — I4891 Unspecified atrial fibrillation: Secondary | ICD-10-CM | POA: Diagnosis not present

## 2023-03-21 ENCOUNTER — Ambulatory Visit (HOSPITAL_BASED_OUTPATIENT_CLINIC_OR_DEPARTMENT_OTHER): Payer: PPO | Admitting: Cardiology

## 2023-04-15 ENCOUNTER — Other Ambulatory Visit: Payer: Self-pay | Admitting: Orthopedic Surgery

## 2023-04-15 DIAGNOSIS — I4821 Permanent atrial fibrillation: Secondary | ICD-10-CM

## 2023-05-02 ENCOUNTER — Encounter: Payer: Self-pay | Admitting: Orthopedic Surgery

## 2023-05-02 ENCOUNTER — Ambulatory Visit (INDEPENDENT_AMBULATORY_CARE_PROVIDER_SITE_OTHER): Payer: PPO | Admitting: Orthopedic Surgery

## 2023-05-02 VITALS — BP 152/90 | HR 73 | Temp 96.5°F | Resp 16 | Ht 71.0 in | Wt 161.2 lb

## 2023-05-02 DIAGNOSIS — I5022 Chronic systolic (congestive) heart failure: Secondary | ICD-10-CM

## 2023-05-02 DIAGNOSIS — I10 Essential (primary) hypertension: Secondary | ICD-10-CM

## 2023-05-02 DIAGNOSIS — I4821 Permanent atrial fibrillation: Secondary | ICD-10-CM | POA: Diagnosis not present

## 2023-05-02 DIAGNOSIS — R972 Elevated prostate specific antigen [PSA]: Secondary | ICD-10-CM

## 2023-05-02 DIAGNOSIS — R350 Frequency of micturition: Secondary | ICD-10-CM

## 2023-05-02 MED ORDER — LISINOPRIL 5 MG PO TABS
5.0000 mg | ORAL_TABLET | Freq: Every day | ORAL | 2 refills | Status: DC
Start: 2023-05-02 — End: 2023-11-07

## 2023-05-02 MED ORDER — CARVEDILOL 6.25 MG PO TABS
6.2500 mg | ORAL_TABLET | Freq: Two times a day (BID) | ORAL | 2 refills | Status: DC
Start: 2023-05-02 — End: 2023-11-07

## 2023-05-02 MED ORDER — APIXABAN 5 MG PO TABS
5.0000 mg | ORAL_TABLET | Freq: Two times a day (BID) | ORAL | 2 refills | Status: DC
Start: 2023-05-02 — End: 2023-06-25

## 2023-05-02 NOTE — Progress Notes (Signed)
 Careteam: Patient Care Team: Octavia Heir, NP as PCP - General (Adult Health Nurse Practitioner) Jodelle Red, MD as PCP - Cardiology (Cardiology)  Seen by: Hazle Nordmann, AGNP-C  PLACE OF SERVICE:  [facility]  Advanced Directive information Does Patient Have a Medical Advance Directive?: Yes, Type of Advance Directive: Healthcare Power of [place];Living will, Does patient want to make changes to medical advance directive?: No - Patient declined  Allergies  Allergen Reactions   Metoprolol Tartrate Nausea Only    Chief Complaint  Patient presents with   Medical Management of Chronic Issues    6 month follow up.      HPI: Patient is a 83 y.o. male seen today for medical management of chronic conditions.   Discussed the use of AI scribe software for clinical note transcription with the patient, who gave verbal consent to proceed.  His blood pressure was noted to be 140/100 and 152/90 mmHg. He reports lower readings at home. He is currently on carvedilol 6.25 mg and lisinopril as prescribed. No chest pain, shortness of breath, headaches or blurred vision.   He is on Eliquis and carvedilol for atrial fibrillation. Requesting refills. He has an upcoming cardiology appointment in April.   He has a history of elevated prostate-specific antigen (PSA) levels and experiences urinary frequency and a sensation of incomplete bladder emptying. Last PSA 13.3. Urology consult was recommended last encounter. He refuses to go to urology at this time. He previously used a supplement that initially helped with bladder emptying but caused him to feel unwell, leading to its discontinuation. He plans to try a different supplement soon.   He recently had cataract surgery performed by [facility], which has significantly improved his vision. He now wears new glasses and reports no issues with his vision.  He reports no falls or accidents, eats three meals a day, and has regular bowel  movements. He consumes a high-fiber cereal called Smart [employer], which he describes as 'not bad' despite its health benefits. No issues with his skin, except for purpura, which he attributes to age and blood thinners. No pain or difficulty with teeth, swallowing, or hearing.       Review of Systems:  Review of Systems  Constitutional: Negative.   HENT: Negative.    Eyes: Negative.   Respiratory:  Negative for cough and shortness of breath.   Cardiovascular:  Negative for chest pain and leg swelling.  Gastrointestinal: Negative.   Genitourinary:  Positive for frequency. Negative for dysuria, flank pain and hematuria.  Musculoskeletal:  Positive for joint pain. Negative for falls.  Skin: Negative.   Neurological: Negative.   Psychiatric/Behavioral: Negative.      Past Medical History:  Diagnosis Date   Arthritis    Atrial fibrillation (HCC)    History of high blood pressure    Past Surgical History:  Procedure Laterality Date   INGUINAL HERNIA REPAIR     bilateral   RIGHT/LEFT HEART CATH AND CORONARY ANGIOGRAPHY N/A 09/24/2017   Procedure: RIGHT/LEFT HEART CATH AND CORONARY ANGIOGRAPHY;  Surgeon: Marykay Lex, MD;  Location: [facility] INVASIVE CV LAB;  Service: Cardiovascular;  Laterality: N/A;   TONSILLECTOMY     Social History:   reports that he has never smoked. He has never used smokeless tobacco. He reports current alcohol use of about 1.0 standard drink of alcohol per week. He reports that he does not use drugs.  Family History  Problem Relation Age of Onset   CAD Father  Hyperlipidemia Father    Heart attack Father 56    Medications: Patient's Medications  New Prescriptions   No medications on file  Previous Medications   B COMPLEX VITAMINS (VITAMIN B COMPLEX PO)    Take 1 tablet by mouth daily.   CARVEDILOL (COREG) 6.25 MG TABLET    Take 1 tablet (6.25 mg total) by mouth 2 (two) times daily with a meal.   CHOLECALCIFEROL (VITAMIN D3) 125 MCG/ML LIQD    Place 4  drops under the tongue daily.   COENZYME Q10 (COQ10) 100 MG CAPS    Take 1 tablet by mouth every other day.   ELIQUIS 5 MG TABS TABLET    TAKE 1 TABLET BY MOUTH TWICE A DAY   LACTOBACILLUS (PROBIOTIC ACIDOPHILUS PO)    Take 1 tablet by mouth daily.   LISINOPRIL (ZESTRIL) 2.5 MG TABLET    Take 1 tablet (2.5 mg total) by mouth daily.   OMEGA-3 FATTY ACIDS (FISH OIL CONCENTRATE PO)    Take 1,600 mg by mouth daily in the afternoon.   VITAMIN C (ASCORBIC ACID) 500 MG TABLET    Take 500 mg by mouth 2 (two) times daily.  Modified Medications   No medications on file  Discontinued Medications   No medications on file    Physical Exam:  Vitals:   05/02/23 1023  BP: (!) 140/100  Pulse: 73  Resp: 16  Temp: (!) 96.5 F (35.8 C)  SpO2: 99%  Weight: 161 lb 3.2 oz (73.1 kg)  Height: 5\' 11"  (1.803 m)   Body mass index is 22.48 kg/m. Wt Readings from Last 3 Encounters:  05/02/23 161 lb 3.2 oz (73.1 kg)  10/25/22 155 lb 12.8 oz (70.7 kg)  01/24/22 159 lb 14.4 oz (72.5 kg)    Physical Exam Vitals reviewed.  Constitutional:      General: He is not in acute distress. HENT:     Head: Normocephalic.  Eyes:     General:        Right eye: No discharge.        Left eye: No discharge.  Cardiovascular:     Rate and Rhythm: Normal rate and regular rhythm.     Pulses: Normal pulses.     Heart sounds: Normal heart sounds.  Pulmonary:     Effort: Pulmonary effort is normal.     Breath sounds: Normal breath sounds.  Abdominal:     General: Bowel sounds are normal.     Palpations: Abdomen is soft.  Musculoskeletal:     Cervical back: Neck supple.     Right lower leg: No edema.     Left lower leg: No edema.  Skin:    General: Skin is warm.     Capillary Refill: Capillary refill takes less than 2 seconds.  Neurological:     General: No focal deficit present.     Mental Status: He is alert and oriented to person, place, and time.     Gait: Gait abnormal.  Psychiatric:        Mood and  Affect: Mood normal.     Labs reviewed: Basic Metabolic Panel: Recent Labs    10/25/22 1036  NA 139  K 4.3  CL 101  CO2 27  GLUCOSE 110*  BUN 14  CREATININE 0.96  CALCIUM 10.0   Liver Function Tests: Recent Labs    10/25/22 1036  AST 22  ALT 14  BILITOT 1.9*  PROT 6.7   No results for input(s): "LIPASE", "AMYLASE"  in the last 8760 hours. No results for input(s): "AMMONIA" in the last 8760 hours. CBC: Recent Labs    10/25/22 1036  WBC 5.7  NEUTROABS 3,705  HGB 13.9  HCT 41.6  MCV 93.5  PLT 148   Lipid Panel: No results for input(s): "CHOL", "HDL", "LDLCALC", "TRIG", "CHOLHDL", "LDLDIRECT" in the last 8760 hours. TSH: No results for input(s): "TSH" in the last 8760 hours. A1C: No results found for: "HGBA1C"   Assessment/Plan 1. Essential hypertension (Primary) - BUN/creat 14/0.96 09/2022 - uncontrolled, goal < 150/90 - will increase lisinopril to 5 mg  - cont carvedilol - CBC with Differential/Platelet - Complete Metabolic Panel with eGFR  2. Permanent atrial fibrillation - followed by cardiology - HR<100 with carvedilol - cont Eliquis for clot prevention - carvedilol (COREG) 6.25 MG tablet; Take 1 tablet (6.25 mg total) by mouth 2 (two) times daily with a meal.  Dispense: 180 tablet; Refill: 2 - apixaban (ELIQUIS) 5 MG TABS tablet; Take 1 tablet (5 mg total) by mouth 2 (two) times daily.  Dispense: 180 tablet; Refill: 2  3. Elevated PSA - PSA 13.3 10/25/2022 - ongoing frequency, does not empty bladder - urology consult recommended last encounter> refused> continues to refuse today - he continues to request supplements  - unsuccessful trial saw palmetto - PSA, Total and Free  4. Urinary frequency - see above - suspect BPH - refused urology consult - refused to try Flomax/finasteride  Total time: 32 minutes. Greater than 50% of total time spent doing patient education regarding health maintenance, HTN, PAF, and urinary frequency including  symptom/medication management.   Next appt: Visit date not found  Haillee Johann Scherry Ran  [facility] [phone]

## 2023-05-03 ENCOUNTER — Other Ambulatory Visit: Payer: Self-pay | Admitting: Orthopedic Surgery

## 2023-05-03 DIAGNOSIS — D696 Thrombocytopenia, unspecified: Secondary | ICD-10-CM

## 2023-05-06 LAB — COMPLETE METABOLIC PANEL WITH GFR
AG Ratio: 2 (calc) (ref 1.0–2.5)
ALT: 22 U/L (ref 9–46)
AST: 27 U/L (ref 10–35)
Albumin: 4.1 g/dL (ref 3.6–5.1)
Alkaline phosphatase (APISO): 87 U/L (ref 35–144)
BUN: 16 mg/dL (ref 7–25)
CO2: 28 mmol/L (ref 20–32)
Calcium: 9.6 mg/dL (ref 8.6–10.3)
Chloride: 101 mmol/L (ref 98–110)
Creat: 0.98 mg/dL (ref 0.70–1.22)
Globulin: 2.1 g/dL (ref 1.9–3.7)
Glucose, Bld: 136 mg/dL (ref 65–139)
Potassium: 4.2 mmol/L (ref 3.5–5.3)
Sodium: 138 mmol/L (ref 135–146)
Total Bilirubin: 2 mg/dL — ABNORMAL HIGH (ref 0.2–1.2)
Total Protein: 6.2 g/dL (ref 6.1–8.1)
eGFR: 76 mL/min/{1.73_m2} (ref >=60)

## 2023-05-06 LAB — CBC WITH DIFFERENTIAL/PLATELET
Absolute Lymphocytes: 1221 {cells}/uL (ref 850–3900)
Absolute Monocytes: 555 {cells}/uL (ref 200–950)
Basophils Absolute: 59 {cells}/uL (ref 0–200)
Basophils Relative: 1 %
Eosinophils Absolute: 153 {cells}/uL (ref 15–500)
Eosinophils Relative: 2.6 %
HCT: 40.7 % (ref 38.5–50.0)
Hemoglobin: 13.5 g/dL (ref 13.2–17.1)
MCH: 31.3 pg (ref 27.0–33.0)
MCHC: 33.2 g/dL (ref 32.0–36.0)
MCV: 94.2 fL (ref 80.0–100.0)
MPV: 12.8 fL — ABNORMAL HIGH (ref 7.5–12.5)
Monocytes Relative: 9.4 %
Neutro Abs: 3912 {cells}/uL (ref 1500–7800)
Neutrophils Relative %: 66.3 %
Platelets: 118 10*3/uL — ABNORMAL LOW (ref 140–400)
RBC: 4.32 10*6/uL (ref 4.20–5.80)
RDW: 12.6 % (ref 11.0–15.0)
Total Lymphocyte: 20.7 %
WBC: 5.9 10*3/uL (ref 3.8–10.8)

## 2023-05-06 LAB — PSA, TOTAL AND FREE
PSA, Free: 1.5 ng/mL
PSA, Total: 11.2 ng/mL — ABNORMAL HIGH (ref <=4.0)

## 2023-05-10 ENCOUNTER — Other Ambulatory Visit: Payer: Self-pay | Admitting: Orthopedic Surgery

## 2023-05-10 ENCOUNTER — Other Ambulatory Visit: Payer: Self-pay

## 2023-05-10 DIAGNOSIS — D696 Thrombocytopenia, unspecified: Secondary | ICD-10-CM

## 2023-05-10 DIAGNOSIS — I428 Other cardiomyopathies: Secondary | ICD-10-CM

## 2023-05-10 DIAGNOSIS — I5022 Chronic systolic (congestive) heart failure: Secondary | ICD-10-CM

## 2023-06-07 ENCOUNTER — Other Ambulatory Visit

## 2023-06-07 DIAGNOSIS — D696 Thrombocytopenia, unspecified: Secondary | ICD-10-CM | POA: Diagnosis not present

## 2023-06-07 LAB — CBC WITH DIFFERENTIAL/PLATELET
Absolute Lymphocytes: 1056 {cells}/uL (ref 850–3900)
Absolute Monocytes: 545 {cells}/uL (ref 200–950)
Basophils Absolute: 50 {cells}/uL (ref 0–200)
Basophils Relative: 0.9 %
Eosinophils Absolute: 182 {cells}/uL (ref 15–500)
Eosinophils Relative: 3.3 %
HCT: 41.8 % (ref 38.5–50.0)
Hemoglobin: 13.6 g/dL (ref 13.2–17.1)
MCH: 31.3 pg (ref 27.0–33.0)
MCHC: 32.5 g/dL (ref 32.0–36.0)
MCV: 96.3 fL (ref 80.0–100.0)
MPV: 12.5 fL (ref 7.5–12.5)
Monocytes Relative: 9.9 %
Neutro Abs: 3669 {cells}/uL (ref 1500–7800)
Neutrophils Relative %: 66.7 %
Platelets: 122 10*3/uL — ABNORMAL LOW (ref 140–400)
RBC: 4.34 10*6/uL (ref 4.20–5.80)
RDW: 12.5 % (ref 11.0–15.0)
Total Lymphocyte: 19.2 %
WBC: 5.5 10*3/uL (ref 3.8–10.8)

## 2023-06-10 ENCOUNTER — Telehealth: Payer: Self-pay

## 2023-06-10 NOTE — Telephone Encounter (Signed)
 Copied from CRM [id number]. Topic: Clinical - Lab/Test Results >> Jun 10, 2023  2:57 PM Brynn Caras wrote: Reason for CRM: The patient is requesting for a physical copy of his lab results to be mailed to his address.

## 2023-06-10 NOTE — Telephone Encounter (Signed)
 Patient labs printed and mailed.

## 2023-06-25 ENCOUNTER — Encounter (HOSPITAL_BASED_OUTPATIENT_CLINIC_OR_DEPARTMENT_OTHER): Payer: Self-pay | Admitting: Cardiology

## 2023-06-25 ENCOUNTER — Ambulatory Visit (HOSPITAL_BASED_OUTPATIENT_CLINIC_OR_DEPARTMENT_OTHER): Payer: PPO | Admitting: Cardiology

## 2023-06-25 VITALS — BP 120/78 | HR 94 | Ht 71.0 in | Wt 154.0 lb

## 2023-06-25 DIAGNOSIS — I4821 Permanent atrial fibrillation: Secondary | ICD-10-CM | POA: Diagnosis not present

## 2023-06-25 DIAGNOSIS — I5022 Chronic systolic (congestive) heart failure: Secondary | ICD-10-CM

## 2023-06-25 DIAGNOSIS — D6869 Other thrombophilia: Secondary | ICD-10-CM | POA: Diagnosis not present

## 2023-06-25 DIAGNOSIS — I428 Other cardiomyopathies: Secondary | ICD-10-CM | POA: Diagnosis not present

## 2023-06-25 NOTE — Patient Instructions (Signed)
 Medication Instructions:  Your physician recommends that you continue on your current medications as directed. Please refer to the Current Medication list given to you today.   *If you need a refill on your cardiac medications before your next appointment, please call your pharmacy*  Follow-Up: At [facility], you and your health needs are our priority.  As part of our continuing mission to provide you with exceptional heart care, our providers are all part of one team.  This team includes your primary Cardiologist (physician) and Advanced Practice Providers or APPs (Physician Assistants and Nurse Practitioners) who all work together to provide you with the care you need, when you need it.  Please follow up in 1 year with Dr. Veryl Gottron, Slater Duncan, NP or Neomi Banks, NP   We recommend signing up for the patient portal called "MyChart".  Sign up information is provided on this After Visit Summary.  MyChart is used to connect with patients for Virtual Visits (Telemedicine).  Patients are able to view lab/test results, encounter notes, upcoming appointments, etc.  Non-urgent messages can be sent to your provider as well.   To learn more about what you can do with MyChart, go to [email or web address].   Other Instructions Our main number: [phone]

## 2023-06-25 NOTE — Progress Notes (Signed)
 Cardiology Office Note:  .   Date:  06/25/2023  ID:  Austin Wong, DOB June 02, 1940, MRN [id number] PCP: Arnetha Bhat, NP  [facility] Providers Cardiologist:  Sheryle Donning, MD {  History of Present Illness: .   Austin Wong is a 83 y.o. male with a hx of atrial fibrillation (presented with RVR) and acute systolic and diastolic heart failure 08/2017. He presents today for follow up.   CV history: In 2019, he presented with a several month history of dyspnea on exertion. He presented with afib RVR and was found to have flatly elevated troponins, elevated BNP, and bilateral pleural effusion. During his hospitalization he had an echo which showed reduced ejection fraction. 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 He was diuresed and started on medications. Of note, please see my notes during his hospitalization--we had long, extensive conversations regarding guideline medical therapy. Mr. Austin Wong has strong personal beliefs regarding medications and supplements to manage his health. As per my notes, his decision regarding medications was to take anticoagulation for atrial fibrillation, rate control and systolic heart failure management with carvedilol , and heart failure management with lisinopril . He declined TEE/CV, aspirin , statin, or entresto.  Today: Overall feels he is doing well. Has mild dyspnea on exertion, has been stable, generally nonlimiting. Fatigue/leg heaviness typically limits him more than his breathing. No edema. Able to work around the house, might have to go slower but can do everything he wants to do.   Tolerating medications well. He stopped apixaban  as he felt like his shortness of breath improved off of this. He is self managing with vitamin E. We discussed risk of stroke.   ROS: Denies chest pain, shortness of breath at rest. No PND, orthopnea, LE edema or  unexpected weight gain. No syncope or palpitations. ROS otherwise negative except as noted.   Studies Reviewed: Aaron Aas    EKG:  EKG Interpretation Date/Time:  Tuesday June 25 2023 10:58:24 EDT Ventricular Rate:  94 PR Interval:    QRS Duration:  82 QT Interval:  354 QTC Calculation: 442 R Axis:   95  Text Interpretation: Atrial fibrillation Rightward axis Anteroseptal infarct (cited on or before 13-Mar-2020) When compared with ECG of 13-Mar-2020 10:15, No significant change was found Confirmed by Sheryle Donning [id number]) on 06/25/2023 7:38:47 PM    Physical Exam:   VS:  BP 120/78   Pulse 94   Ht 5\' 11"  (1.803 m)   Wt 154 lb (69.9 kg)   SpO2 95%   BMI 21.48 kg/m    Wt Readings from Last 3 Encounters:  06/25/23 154 lb (69.9 kg)  05/02/23 161 lb 3.2 oz (73.1 kg)  10/25/22 155 lb 12.8 oz (70.7 kg)    GEN: Well nourished, well developed in no acute distress HEENT: Normal, moist mucous membranes NECK: No JVD CARDIAC: irregularly irregular rhythm, normal S1 and S2, no rubs or gallops. No murmur. VASCULAR: Radial and DP pulses 2+ bilaterally. No carotid bruits RESPIRATORY:  Clear to auscultation without rales, wheezing or rhonchi  ABDOMEN: Soft, non-tender, non-distended MUSCULOSKELETAL:  Ambulates independently SKIN: Warm and dry, no edema NEUROLOGIC:  Alert and oriented x 3. No focal neuro deficits noted. PSYCHIATRIC:  Normal affect    ASSESSMENT AND PLAN: .    Atrial fibrillation: rate controlled, permanent, asymptomatic -CHA2DS2/VAS Stroke Risk Points = 4  -he has stopped anticoagulation on his own. He is taking vitamin E.  We discussed the risk of stroke at length. Discussed alternative anticoagulation, discussed watchman. He declines all of the above. He would rather use natural supplements and understands the risk of stroke -rate control with carvedilol    Chronic systolic heart failure, nonischemic cardiomyopathy:   -euvolemic, NYHA class 1-2 -we have discussed his  preferred GDMT at length, see prior notes. He feels well on current regimen -continue carvedilol , lisinopril . Declines entreso, ICD. We have discussed recheck echo in the past, but as it would not change management, declined.  Dispo: 1 year or sooner as needed  Signed, Sheryle Donning, MD   Sheryle Donning, MD, PhD, [facility] [employer]  [facility]    Heart & Vascular at [facility] at [facility] [address] [place], [place] [address] [phone]

## 2023-07-01 ENCOUNTER — Telehealth: Payer: Self-pay | Admitting: Orthopedic Surgery

## 2023-07-01 NOTE — Telephone Encounter (Signed)
 Patient came in and requested a DNR form completed. He was told we would call him when PCP do it on Thursday when she come in.

## 2023-11-03 ENCOUNTER — Emergency Department (HOSPITAL_COMMUNITY)

## 2023-11-03 ENCOUNTER — Encounter (HOSPITAL_COMMUNITY): Payer: Self-pay | Admitting: *Deleted

## 2023-11-03 ENCOUNTER — Inpatient Hospital Stay (HOSPITAL_COMMUNITY)
Admission: EM | Admit: 2023-11-03 | Discharge: 2023-11-05 | DRG: 291 | Disposition: A | Discharge status: Home / Self Care | Attending: Internal Medicine | Admitting: Internal Medicine

## 2023-11-03 ENCOUNTER — Other Ambulatory Visit: Payer: Self-pay

## 2023-11-03 DIAGNOSIS — I5023 Acute on chronic systolic (congestive) heart failure: Secondary | ICD-10-CM | POA: Diagnosis not present

## 2023-11-03 DIAGNOSIS — M159 Polyosteoarthritis, unspecified: Secondary | ICD-10-CM | POA: Diagnosis not present

## 2023-11-03 DIAGNOSIS — I5021 Acute systolic (congestive) heart failure: Secondary | ICD-10-CM | POA: Diagnosis not present

## 2023-11-03 DIAGNOSIS — R413 Other amnesia: Secondary | ICD-10-CM | POA: Diagnosis not present

## 2023-11-03 DIAGNOSIS — I517 Cardiomegaly: Secondary | ICD-10-CM | POA: Diagnosis not present

## 2023-11-03 DIAGNOSIS — I10 Essential (primary) hypertension: Secondary | ICD-10-CM | POA: Diagnosis present

## 2023-11-03 DIAGNOSIS — J90 Pleural effusion, not elsewhere classified: Secondary | ICD-10-CM | POA: Diagnosis not present

## 2023-11-03 DIAGNOSIS — I4821 Permanent atrial fibrillation: Secondary | ICD-10-CM | POA: Diagnosis present

## 2023-11-03 DIAGNOSIS — I444 Left anterior fascicular block: Secondary | ICD-10-CM | POA: Diagnosis present

## 2023-11-03 DIAGNOSIS — Z79899 Other long term (current) drug therapy: Secondary | ICD-10-CM

## 2023-11-03 DIAGNOSIS — E876 Hypokalemia: Secondary | ICD-10-CM | POA: Diagnosis present

## 2023-11-03 DIAGNOSIS — R0602 Shortness of breath: Secondary | ICD-10-CM | POA: Diagnosis not present

## 2023-11-03 DIAGNOSIS — Z1152 Encounter for screening for COVID-19: Secondary | ICD-10-CM

## 2023-11-03 DIAGNOSIS — I2489 Other forms of acute ischemic heart disease: Secondary | ICD-10-CM | POA: Diagnosis present

## 2023-11-03 DIAGNOSIS — R0609 Other forms of dyspnea: Secondary | ICD-10-CM | POA: Diagnosis not present

## 2023-11-03 DIAGNOSIS — J9811 Atelectasis: Secondary | ICD-10-CM | POA: Diagnosis not present

## 2023-11-03 DIAGNOSIS — I5043 Acute on chronic combined systolic (congestive) and diastolic (congestive) heart failure: Principal | ICD-10-CM | POA: Diagnosis present

## 2023-11-03 DIAGNOSIS — I2510 Atherosclerotic heart disease of native coronary artery without angina pectoris: Secondary | ICD-10-CM | POA: Diagnosis present

## 2023-11-03 DIAGNOSIS — I509 Heart failure, unspecified: Secondary | ICD-10-CM

## 2023-11-03 DIAGNOSIS — I110 Hypertensive heart disease with heart failure: Secondary | ICD-10-CM | POA: Diagnosis not present

## 2023-11-03 DIAGNOSIS — Z91148 Patient's other noncompliance with medication regimen for other reason: Secondary | ICD-10-CM

## 2023-11-03 DIAGNOSIS — I428 Other cardiomyopathies: Secondary | ICD-10-CM | POA: Diagnosis present

## 2023-11-03 DIAGNOSIS — Z8249 Family history of ischemic heart disease and other diseases of the circulatory system: Secondary | ICD-10-CM

## 2023-11-03 DIAGNOSIS — Z91041 Radiographic dye allergy status: Secondary | ICD-10-CM

## 2023-11-03 DIAGNOSIS — Z8349 Family history of other endocrine, nutritional and metabolic diseases: Secondary | ICD-10-CM

## 2023-11-03 DIAGNOSIS — Z888 Allergy status to other drugs, medicaments and biological substances status: Secondary | ICD-10-CM

## 2023-11-03 LAB — CBC
HCT: 42.2 % (ref 39.0–52.0)
Hemoglobin: 13.9 g/dL (ref 13.0–17.0)
MCH: 31.1 pg (ref 26.0–34.0)
MCHC: 32.9 g/dL (ref 30.0–36.0)
MCV: 94.4 fL (ref 80.0–100.0)
Platelets: 175 K/uL (ref 150–400)
RBC: 4.47 MIL/uL (ref 4.22–5.81)
RDW: 13.4 % (ref 11.5–15.5)
WBC: 6.9 K/uL (ref 4.0–10.5)
nRBC: 0 % (ref 0.0–0.2)

## 2023-11-03 LAB — BASIC METABOLIC PANEL WITH GFR
Anion gap: 13 (ref 5–15)
BUN: 18 mg/dL (ref 8–23)
CO2: 23 mmol/L (ref 22–32)
Calcium: 9.4 mg/dL (ref 8.9–10.3)
Chloride: 100 mmol/L (ref 98–111)
Creatinine, Ser: 1.1 mg/dL (ref 0.61–1.24)
GFR, Estimated: >60 mL/min (ref >60)
Glucose, Bld: 122 mg/dL — ABNORMAL HIGH (ref 70–99)
Potassium: 4.1 mmol/L (ref 3.5–5.1)
Sodium: 136 mmol/L (ref 135–145)

## 2023-11-03 LAB — TROPONIN I (HIGH SENSITIVITY)
Troponin I (High Sensitivity): 284 ng/L (ref <18)
Troponin I (High Sensitivity): 301 ng/L (ref <18)

## 2023-11-03 LAB — RESP PANEL BY RT-PCR (RSV, FLU A&B, COVID)  RVPGX2
Influenza A by PCR: NEGATIVE
Influenza B by PCR: NEGATIVE
Resp Syncytial Virus by PCR: NEGATIVE
SARS Coronavirus 2 by RT PCR: NEGATIVE

## 2023-11-03 LAB — I-STAT CHEM 8, ED
BUN: 18 mg/dL (ref 8–23)
Calcium, Ion: 1.19 mmol/L (ref 1.15–1.40)
Chloride: 101 mmol/L (ref 98–111)
Creatinine, Ser: 1.2 mg/dL (ref 0.61–1.24)
Glucose, Bld: 126 mg/dL — ABNORMAL HIGH (ref 70–99)
HCT: 43 % (ref 39.0–52.0)
Hemoglobin: 14.6 g/dL (ref 13.0–17.0)
Potassium: 4.2 mmol/L (ref 3.5–5.1)
Sodium: 137 mmol/L (ref 135–145)
TCO2: 22 mmol/L (ref 22–32)

## 2023-11-03 LAB — D-DIMER, QUANTITATIVE: D-Dimer, Quant: 1.73 ug{FEU}/mL — ABNORMAL HIGH (ref 0.00–0.50)

## 2023-11-03 LAB — BRAIN NATRIURETIC PEPTIDE: B Natriuretic Peptide: 396.4 pg/mL — ABNORMAL HIGH (ref 0.0–100.0)

## 2023-11-03 MED ORDER — FUROSEMIDE 10 MG/ML IJ SOLN
20.0000 mg | Freq: Once | INTRAMUSCULAR | Status: AC
  Administered 2023-11-03: 20 mg via INTRAVENOUS
  Filled 2023-11-03: qty 2

## 2023-11-03 NOTE — ED Provider Triage Note (Signed)
 Emergency Medicine Provider Triage Evaluation Note  Austin Wong , a 83 y.o. male  was evaluated in triage.  Pt complains of SOB, CP, BLE edema.  Review of Systems  Positive: Wet cough  Negative: Fever  Physical Exam  BP (!) 152/106 (BP Location: Left Arm)   Pulse 65   Temp (!) 97.5 F (36.4 C)   Resp 17   Ht 5' 11 (1.803 m)   Wt 69.9 kg   SpO2 100%   BMI 21.49 kg/m  Gen:   Awake, no distress   Resp:  Normal effort  MSK:   Moves extremities without difficulty  Other:    Medical Decision Making  Medically screening exam initiated at 7:04 PM.  Appropriate orders placed.  Normal [name] was informed that the remainder of the evaluation will be completed by another provider, this initial triage assessment does not replace that evaluation, and the importance of remaining in the ED until their evaluation is complete.     Austin Warren SAILOR, PA-C 11/03/23 1904

## 2023-11-03 NOTE — ED Provider Notes (Signed)
 [place] EMERGENCY DEPARTMENT AT [facility] Provider Note HPI Austin Wong is a 83 y.o. male with a medical history of combined systolic and diastolic heart failure along with A-fib (not on [facility]) who presents with subacute exertional dyspnea.  The patient states that he only takes carvedilol  and lisinopril .  He stopped taking his Eliquis  2 months ago and is instead taking vitamin E as an anticoagulant.  He has been feeling more short of breath over the last few weeks, worse with exertion.  He is coming in today because the shortness of breath worsened to the point where he has it at rest.  Denies trouble lying flat.  Denies chest pain, denies recent infectious symptoms including fever, cough, vomiting, diarrhea  Past Medical History:  Diagnosis Date   Arthritis    Atrial fibrillation (HCC)    History of high blood pressure    Hypertension    Past Surgical History:  Procedure Laterality Date   INGUINAL HERNIA REPAIR     bilateral   RIGHT/LEFT HEART CATH AND CORONARY ANGIOGRAPHY N/A 09/24/2017   Procedure: RIGHT/LEFT HEART CATH AND CORONARY ANGIOGRAPHY;  Surgeon: Anner Alm ORN, MD;  Location: [facility] INVASIVE CV LAB;  Service: Cardiovascular;  Laterality: N/A;   TONSILLECTOMY      Review of Systems Pertinent positives and negative findings are listed as part of the History of Present Illness and MDM  Physical Exam Vitals:   11/03/23 1820 11/03/23 2117 11/03/23 2118 11/03/23 2230  BP:  (!) 132/106 (!) 135/103 (!) 138/95  Pulse:  78 88 84  Resp:  18 18 19   Temp:  (!) 97.5 F (36.4 C) (!) 97.5 F (36.4 C)   SpO2:  100% 99% 97%  Weight: 69.9 kg     Height: 5' 11 (1.803 m)        Constitutional Nursing notes reviewed Vital signs reviewed  HEENT No obvious trauma Pupils round, equal, and reactive to light. Pupils cross midline Neck supple  Respiratory Effort normal Breathing well on room air Bibasilar crackles  CV Irregular rate and rhythm JVD present 2+ pitting  edema  Abdomen Soft, non-tender, non-distended No peritonitis  MSK Atraumatic No obvious deformity ROM appropriate  Neuro Awake and alert Pupils cross midline Moving all extremities    MDM:  Initial Differential Diagnoses includes CHF exacerbation, pneumonia, pneumothorax, PE  I reviewed the patient's vitals, the nursing triage note and evaluated the patient at bedside.   I reviewed the patient's external records which show that the patient is followed by cardiology for combined systolic and diastolic heart failure along with A-fib.  Patient was first diagnosed with heart failure in 2019 and had minimal CAD not amenable to intervention.  At that time he was recommended for him to start on GDMT.  He declined to multiple medications but agreed to take an anticoagulant, carvedilol  and lisinopril .  He has since stopped taking Eliquis  and is taking vitamin E as his anticoagulant.  I considered ACS and reviewed/interpreted the EKG. EKG shows atrial fibrillation with a normal ventricular rate.  No evidence of acute ischemic changes.  The QRS and QT intervals are within normal limits.  Patient has a high risk heart score given known CAD. troponin  284 --> 301.  With no evidence of acute ischemic changes or chest pain, this is likely demand ischemia in the setting of CHF exacerbation  Chest x-ray reviewed myself shows small bilateral pleural effusions with no pneumothorax or pneumonia.  Remainder of the labs interpreted by myself  show no evidence of leukocytosis, anemia, significant electrolyte abnormality or AKI.  BNP elevated to 400.  I considered pulmonary embolism and ordered a CT PE the patient states that he has a severe allergy to iodine and would not want contrast at this time.  I discussed with the hospitalist, Dr. Maree and we will hold off on the CT PE at this time.  I have low suspicion for PE as the patient is hemodynamically stable, has no tachycardia, tachypnea or pleuritic chest pain.   He has a very likely other etiology for his shortness of breath.  Patient given 20 mg of IV Lasix  and admitted to the hospitalist team for further workup and care  Procedures: Procedures  Medications administered in the ED: Medications  furosemide  (LASIX ) injection 20 mg (20 mg Intravenous Given 11/03/23 2320)     Impression: 1. Acute on chronic combined systolic and diastolic congestive heart failure (HCC)   2. Dyspnea on exertion      Patient's presentation is most consistent with acute presentation with potential threat to life or bodily function.  Disposition: ED Disposition:  Admit   Discharge: Patient is felt to be medically appropriate for discharge at this time. Patient was instructed to follow up with their primary care doctor/specialists listed above for re-evaluation. Patient was given strict return precautions.  ED Discharge Orders     None             Dionisio Blunt, MD 11/03/23 2352    Yolande Lamar BROCKS, MD 11/10/23 0005

## 2023-11-03 NOTE — ED Triage Notes (Signed)
 The pt is c/o  chest pain and sob for one week  also an irregular pulse  on no 02 at home

## 2023-11-04 ENCOUNTER — Observation Stay (HOSPITAL_COMMUNITY)

## 2023-11-04 DIAGNOSIS — I110 Hypertensive heart disease with heart failure: Secondary | ICD-10-CM | POA: Diagnosis not present

## 2023-11-04 DIAGNOSIS — Z8249 Family history of ischemic heart disease and other diseases of the circulatory system: Secondary | ICD-10-CM | POA: Diagnosis not present

## 2023-11-04 DIAGNOSIS — M159 Polyosteoarthritis, unspecified: Secondary | ICD-10-CM

## 2023-11-04 DIAGNOSIS — I5021 Acute systolic (congestive) heart failure: Secondary | ICD-10-CM

## 2023-11-04 DIAGNOSIS — I2489 Other forms of acute ischemic heart disease: Secondary | ICD-10-CM | POA: Diagnosis not present

## 2023-11-04 DIAGNOSIS — I4821 Permanent atrial fibrillation: Secondary | ICD-10-CM

## 2023-11-04 DIAGNOSIS — I10 Essential (primary) hypertension: Secondary | ICD-10-CM

## 2023-11-04 DIAGNOSIS — Z91041 Radiographic dye allergy status: Secondary | ICD-10-CM | POA: Diagnosis not present

## 2023-11-04 DIAGNOSIS — Z888 Allergy status to other drugs, medicaments and biological substances status: Secondary | ICD-10-CM | POA: Diagnosis not present

## 2023-11-04 DIAGNOSIS — Z79899 Other long term (current) drug therapy: Secondary | ICD-10-CM | POA: Diagnosis not present

## 2023-11-04 DIAGNOSIS — R0609 Other forms of dyspnea: Secondary | ICD-10-CM | POA: Diagnosis present

## 2023-11-04 DIAGNOSIS — Z8349 Family history of other endocrine, nutritional and metabolic diseases: Secondary | ICD-10-CM | POA: Diagnosis not present

## 2023-11-04 DIAGNOSIS — I5023 Acute on chronic systolic (congestive) heart failure: Secondary | ICD-10-CM | POA: Diagnosis not present

## 2023-11-04 DIAGNOSIS — I444 Left anterior fascicular block: Secondary | ICD-10-CM | POA: Diagnosis not present

## 2023-11-04 DIAGNOSIS — I5043 Acute on chronic combined systolic (congestive) and diastolic (congestive) heart failure: Secondary | ICD-10-CM | POA: Diagnosis not present

## 2023-11-04 DIAGNOSIS — Z1152 Encounter for screening for COVID-19: Secondary | ICD-10-CM | POA: Diagnosis not present

## 2023-11-04 DIAGNOSIS — E876 Hypokalemia: Secondary | ICD-10-CM

## 2023-11-04 DIAGNOSIS — I2510 Atherosclerotic heart disease of native coronary artery without angina pectoris: Secondary | ICD-10-CM | POA: Diagnosis not present

## 2023-11-04 DIAGNOSIS — I428 Other cardiomyopathies: Secondary | ICD-10-CM | POA: Diagnosis not present

## 2023-11-04 DIAGNOSIS — I509 Heart failure, unspecified: Secondary | ICD-10-CM | POA: Insufficient documentation

## 2023-11-04 DIAGNOSIS — R413 Other amnesia: Secondary | ICD-10-CM

## 2023-11-04 DIAGNOSIS — Z91148 Patient's other noncompliance with medication regimen for other reason: Secondary | ICD-10-CM | POA: Diagnosis not present

## 2023-11-04 LAB — BASIC METABOLIC PANEL WITH GFR
Anion gap: 15 (ref 5–15)
BUN: 16 mg/dL (ref 8–23)
CO2: 24 mmol/L (ref 22–32)
Calcium: 9.1 mg/dL (ref 8.9–10.3)
Chloride: 98 mmol/L (ref 98–111)
Creatinine, Ser: 1.03 mg/dL (ref 0.61–1.24)
GFR, Estimated: >60 mL/min (ref >60)
Glucose, Bld: 211 mg/dL — ABNORMAL HIGH (ref 70–99)
Potassium: 3.3 mmol/L — ABNORMAL LOW (ref 3.5–5.1)
Sodium: 137 mmol/L (ref 135–145)

## 2023-11-04 LAB — MAGNESIUM
Magnesium: 1.7 mg/dL (ref 1.7–2.4)
Magnesium: 1.7 mg/dL (ref 1.7–2.4)

## 2023-11-04 LAB — ECHOCARDIOGRAM COMPLETE
AR max vel: 2.86 cm2
AV Peak grad: 4.3 mmHg
Ao pk vel: 1.04 m/s
Area-P 1/2: 3.85 cm2
Calc EF: 27.1 %
Height: 71 in
S' Lateral: 4.1 cm
Single Plane A2C EF: 28.5 %
Single Plane A4C EF: 30.6 %
Weight: 2432.01 [oz_av]

## 2023-11-04 LAB — CBC
HCT: 40.5 % (ref 39.0–52.0)
Hemoglobin: 13.7 g/dL (ref 13.0–17.0)
MCH: 31.4 pg (ref 26.0–34.0)
MCHC: 33.8 g/dL (ref 30.0–36.0)
MCV: 92.7 fL (ref 80.0–100.0)
Platelets: 154 K/uL (ref 150–400)
RBC: 4.37 MIL/uL (ref 4.22–5.81)
RDW: 13.4 % (ref 11.5–15.5)
WBC: 7 K/uL (ref 4.0–10.5)
nRBC: 0 % (ref 0.0–0.2)

## 2023-11-04 LAB — CREATININE, SERUM
Creatinine, Ser: 1.12 mg/dL (ref 0.61–1.24)
GFR, Estimated: >60 mL/min (ref >60)

## 2023-11-04 MED ORDER — EMPAGLIFLOZIN 10 MG PO TABS
10.0000 mg | ORAL_TABLET | Freq: Every day | ORAL | Status: DC
  Administered 2023-11-05: 10 mg via ORAL
  Filled 2023-11-04: qty 1

## 2023-11-04 MED ORDER — FUROSEMIDE 10 MG/ML IJ SOLN
20.0000 mg | Freq: Two times a day (BID) | INTRAMUSCULAR | Status: DC
  Administered 2023-11-04: 20 mg via INTRAVENOUS
  Filled 2023-11-04: qty 2

## 2023-11-04 MED ORDER — SODIUM CHLORIDE 0.9 % IV SOLN
250.0000 mL | INTRAVENOUS | Status: AC | PRN
Start: 2023-11-04 — End: 2023-11-05

## 2023-11-04 MED ORDER — POTASSIUM CHLORIDE CRYS ER 20 MEQ PO TBCR
40.0000 meq | EXTENDED_RELEASE_TABLET | ORAL | Status: AC
  Administered 2023-11-04 (×2): 40 meq via ORAL
  Filled 2023-11-04 (×2): qty 2

## 2023-11-04 MED ORDER — ENOXAPARIN SODIUM 40 MG/0.4ML IJ SOSY
40.0000 mg | PREFILLED_SYRINGE | INTRAMUSCULAR | Status: DC
Start: 2023-11-04 — End: 2023-11-04
  Administered 2023-11-04: 40 mg via SUBCUTANEOUS
  Filled 2023-11-04: qty 0.4

## 2023-11-04 MED ORDER — ALBUTEROL SULFATE (2.5 MG/3ML) 0.083% IN NEBU: 3.0000 mL | INHALATION_SOLUTION | Freq: Four times a day (QID) | RESPIRATORY_TRACT | Status: DC | PRN

## 2023-11-04 MED ORDER — ONDANSETRON HCL 4 MG/2ML IJ SOLN
4.0000 mg | Freq: Four times a day (QID) | INTRAMUSCULAR | Status: DC | PRN
Start: 2023-11-04 — End: 2023-11-05

## 2023-11-04 MED ORDER — SODIUM CHLORIDE 0.9% FLUSH
3.0000 mL | Freq: Two times a day (BID) | INTRAVENOUS | Status: DC
  Administered 2023-11-04 – 2023-11-05 (×4): 3 mL via INTRAVENOUS

## 2023-11-04 MED ORDER — SODIUM CHLORIDE 0.9% FLUSH: 3.0000 mL | INTRAVENOUS | Status: DC | PRN

## 2023-11-04 MED ORDER — LISINOPRIL 5 MG PO TABS
5.0000 mg | ORAL_TABLET | Freq: Every day | ORAL | Status: DC
  Administered 2023-11-04 – 2023-11-05 (×2): 5 mg via ORAL
  Filled 2023-11-04 (×2): qty 1

## 2023-11-04 MED ORDER — CARVEDILOL 6.25 MG PO TABS
6.2500 mg | ORAL_TABLET | Freq: Two times a day (BID) | ORAL | Status: DC
  Administered 2023-11-04 – 2023-11-05 (×3): 6.25 mg via ORAL
  Filled 2023-11-04 (×3): qty 1

## 2023-11-04 MED ORDER — FUROSEMIDE 10 MG/ML IJ SOLN
40.0000 mg | Freq: Once | INTRAMUSCULAR | Status: AC
Start: 2023-11-04 — End: 2023-11-04
  Administered 2023-11-04: 40 mg via INTRAVENOUS
  Filled 2023-11-04: qty 4

## 2023-11-04 MED ORDER — SPIRONOLACTONE 12.5 MG HALF TABLET
12.5000 mg | ORAL_TABLET | Freq: Every day | ORAL | Status: DC
  Administered 2023-11-04 – 2023-11-05 (×2): 12.5 mg via ORAL
  Filled 2023-11-04 (×2): qty 1

## 2023-11-04 MED ORDER — APIXABAN 5 MG PO TABS
5.0000 mg | ORAL_TABLET | Freq: Two times a day (BID) | ORAL | Status: DC
  Administered 2023-11-04 – 2023-11-05 (×2): 5 mg via ORAL
  Filled 2023-11-04 (×2): qty 1

## 2023-11-04 MED ORDER — ACETAMINOPHEN 325 MG PO TABS: 650.0000 mg | ORAL_TABLET | ORAL | Status: DC | PRN

## 2023-11-04 MED ORDER — MAGNESIUM SULFATE 2 GM/50ML IV SOLN
2.0000 g | Freq: Once | INTRAVENOUS | Status: AC
  Administered 2023-11-04: 2 g via INTRAVENOUS
  Filled 2023-11-04: qty 50

## 2023-11-04 NOTE — Assessment & Plan Note (Signed)
 Follow up as outpatient

## 2023-11-04 NOTE — Progress Notes (Addendum)
 Progress Note   Patient: Austin Wong FMW:[id number] DOB: 10/15/1940 DOA: 11/03/2023     0 DOS: the patient was seen and examined on 11/04/2023   Brief hospital course: Austin Wong was admitted to the hospital with the working diagnosis of heart failure exacerbation.   83 yo male with the past medical history of heart failure, coronary artery disease, hypertension and ostearthritis who presented with dyspnea. He has not been taking apixaban  for the last 2 months prior to admission. On his initial physical examination his blood pressure was 132/106, HR 97, RR 19 and 02 saturation 97%.  Lungs with no wheezing or rhonchi, heart with S1 and S2 present and regular with no gallops or rubs, abdomen not distended, no lower extremity edema.   Na 136, K 4.1 Cl 100 bicarbonate 23, glucose 122 bun 18 cr 1.1 BNP 396  Wbc 6.9 hgb 13.9 plt 175  High sensitive troponin 284 and 301  D dimer 1,73  Sars COVID 19 negative Influenza negative  RSV negative   Chest radiograph with hyperinflation, no cardiomegaly, bilateral small pleural effusions, bilateral hilar vascular congestion.   EKG 90 bpm, left axis, left anterior fascicular block, qtc 452, atrial fibrillation rhythm with no significant ST segment or T wave changes.   Patient was placed on IV furosemide  for diuresis.   Assessment and Plan: * Acute on chronic systolic CHF (congestive heart failure) (HCC) Echocardiogram with reduced LV systolic function EF 25 to 30%, global hypokinesis, LV cavity with moderate dilatation, mild LVH, RV systolic function preserved, LA with moderate dilatation, RA with severe dilatation, no significant valvular disease.   Urine output 1900 ml Systolic blood pressure 115 mmHg.   Volume status has improved, will give one total dose of 60 mg IV furosemide  today.  Medical therapy with carvedilol  and lisinopril .  Add spironolactone  and SGLT 2 inh.   Essential hypertension Continue blood pressure control with carvedilol   and lisinopril .   Permanent atrial fibrillation Continue rate control with carvedilol , will resume apixaban  for anticoagulation.  Continue telemetry monitoring   Hypokalemia Renal function with serum cr at 1,0 with K at 3,3 and serum bicarbonate at 24  Na 137 and Mg 1,7   Will add Kcl 40 meq x2 and 2 g mag sulfate Continue diuresis  Follow up renal function and electrolytes in am.   Memory changes Follow up as outpatient.   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   Physical Exam: Vitals:   11/04/23 0406 11/04/23 0713 11/04/23 0820 11/04/23 1143  BP: (!) 149/92 118/80  106/73  Pulse: 96 76  75  Resp: 14 (!) 22  20  Temp: 97.7 F (36.5 C) (!) 97.5 F (36.4 C)  (!) 97.4 F (36.3 C)  TempSrc: Oral Oral  Oral  SpO2: 94% 97%  95%  Weight:   68.9 kg   Height:       Neurology awake and alert ENT with mild pallor Cardiovascular with S1 and S2 present irregularly irregular with no gallops or rubs,  Respiratory with mild rales at bases with no wheezing or rhonchi  Abdomen with no distention  Lower extremity edema trace.   Data Reviewed:    Family Communication: I spoke with patient's daughter at the bedside, we talked in detail about patient's condition, plan of care and prognosis and all questions were  addressed.   Disposition: Status is: Inpatient Remains inpatient appropriate because: diuresis   Planned Discharge Destination: Home     Author: Elidia Toribio Furnace, MD 11/04/2023 4:15 PM  For on call review [email or web address].

## 2023-11-04 NOTE — H&P (Signed)
 History and Physical    Austin Wong FMW:[id number] DOB: [birth year] DOA: 11/03/2023  PCP: Gil Greig BRAVO, NP   Patient coming from: Home  Chief Complaint: Dyspnea on exertion  HPI: Austin Wong is a 83 y.o. male with medical history significant for chronic systolic CHF, CAD, atrial fibrillation, hypertension, and osteoarthritis who presents with exertional dyspnea that he claims has been ongoing for the last month or so.  He states that he only takes his carvedilol  and lisinopril  and stopped taking his Eliquis  about 2 months ago instead has been taking vitamin E as an anticoagulant.  He states that the Eliquis  did not make him feel good and now states that he has shortness of breath even at rest.  He denies any orthopnea or chest pain and has not had any recent upper respiratory infection symptoms such as fever, cough, vomiting, or diarrhea.  He was previously noted to have significant systolic dysfunction with nonischemic cardiomyopathy.   ED Course: Patient noted to have some elevated blood pressures in the ED and troponin was initially 284 and follow-up was 301.  Chest x-ray shows some cardiomegaly with bilateral effusions.  BNP 396.4.  Patient has been started on IV Lasix  for diuresis.  Review of Systems: Reviewed as noted above, otherwise negative.  Past Medical History:  Diagnosis Date   Arthritis    Atrial fibrillation (HCC)    History of high blood pressure    Hypertension     Past Surgical History:  Procedure Laterality Date   INGUINAL HERNIA REPAIR     bilateral   RIGHT/LEFT HEART CATH AND CORONARY ANGIOGRAPHY N/A 09/24/2017   Procedure: RIGHT/LEFT HEART CATH AND CORONARY ANGIOGRAPHY;  Surgeon: Anner Alm ORN, MD;  Location: [facility] INVASIVE CV LAB;  Service: Cardiovascular;  Laterality: N/A;   TONSILLECTOMY       reports that he has never smoked. He has never used smokeless tobacco. He reports current alcohol use of about 1.0 standard drink of alcohol per week. He reports that  he does not use drugs.  Allergies  Allergen Reactions   Iodine    Metoprolol Tartrate Nausea Only    Family History  Problem Relation Age of Onset   CAD Father    Hyperlipidemia Father    Heart attack Father 65    Prior to Admission medications   Medication Sig Start Date End Date Taking? Authorizing Provider  B Complex Vitamins (VITAMIN B COMPLEX PO) Take 1 tablet by mouth daily.    [provider]  carvedilol  (COREG ) 6.25 MG tablet Take 1 tablet (6.25 mg total) by mouth 2 (two) times daily with a meal. 05/02/23   Fargo, Amy E, NP  Cholecalciferol (VITAMIN D3) 125 MCG/ML LIQD Place 4 drops under the tongue daily.    [provider]  Coenzyme Q10 (COQ10) 100 MG CAPS Take 1 tablet by mouth every other day.    [provider]  Lactobacillus (PROBIOTIC ACIDOPHILUS PO) Take 1 tablet by mouth daily.    [provider]  lisinopril  (ZESTRIL ) 5 MG tablet Take 1 tablet (5 mg total) by mouth daily. 05/02/23   Fargo, Amy E, NP  Omega-3 Fatty Acids (FISH OIL CONCENTRATE PO) Take 1,600 mg by mouth daily in the afternoon.    [provider]  vitamin C (ASCORBIC ACID) 500 MG tablet Take 500 mg by mouth 2 (two) times daily.    [provider]    Physical Exam: Vitals:   11/03/23 2117 11/03/23 2118 11/03/23 2230 11/04/23 0009  BP: ROLLEN)  132/106 (!) 135/103 (!) 138/95 (!) 148/110  Pulse: 78 88 84 97  Resp: 18 18 19 19   Temp: (!) 97.5 F (36.4 C) (!) 97.5 F (36.4 C)  98 F (36.7 C)  TempSrc:    Oral  SpO2: 100% 99% 97% 94%  Weight:    68.9 kg  Height:    5' 11 (1.803 m)    Constitutional: NAD, calm, comfortable Vitals:   11/03/23 2117 11/03/23 2118 11/03/23 2230 11/04/23 0009  BP: (!) 132/106 (!) 135/103 (!) 138/95 (!) 148/110  Pulse: 78 88 84 97  Resp: 18 18 19 19   Temp: (!) 97.5 F (36.4 C) (!) 97.5 F (36.4 C)  98 F (36.7 C)  TempSrc:    Oral  SpO2: 100% 99% 97% 94%  Weight:    68.9 kg  Height:    5' 11 (1.803 m)   Eyes:  lids and conjunctivae normal Neck: normal, supple Respiratory: clear to auscultation bilaterally. Normal respiratory effort. No accessory muscle use.  Cardiovascular: Regular rate and rhythm, no murmurs. Abdomen: no tenderness, no distention. Bowel sounds positive.  Musculoskeletal:  No edema. Skin: no rashes, lesions, ulcers.  Psychiatric: Flat affect  Labs on Admission: I have personally reviewed following labs and imaging studies  CBC: Recent Labs  Lab 11/03/23 1826 11/03/23 1837  WBC 6.9  --   HGB 13.9 14.6  HCT 42.2 43.0  MCV 94.4  --   PLT 175  --    Basic Metabolic Panel: Recent Labs  Lab 11/03/23 1826 11/03/23 1837  NA 136 137  K 4.1 4.2  CL 100 101  CO2 23  --   GLUCOSE 122* 126*  BUN 18 18  CREATININE 1.10 1.20  CALCIUM 9.4  --    GFR: Estimated Creatinine Clearance: 44.7 mL/min (by C-G formula based on SCr of 1.2 mg/dL). Liver Function Tests: No results for input(s): AST, ALT, ALKPHOS, BILITOT, PROT, ALBUMIN  in the last 168 hours. No results for input(s): LIPASE, AMYLASE in the last 168 hours. No results for input(s): AMMONIA in the last 168 hours. Coagulation Profile: No results for input(s): INR, PROTIME in the last 168 hours. Cardiac Enzymes: No results for input(s): CKTOTAL, CKMB, CKMBINDEX, TROPONINI in the last 168 hours. BNP (last 3 results) No results for input(s): PROBNP in the last 8760 hours. HbA1C: No results for input(s): HGBA1C in the last 72 hours. CBG: No results for input(s): GLUCAP in the last 168 hours. Lipid Profile: No results for input(s): CHOL, HDL, LDLCALC, TRIG, CHOLHDL, LDLDIRECT in the last 72 hours. Thyroid  Function Tests: No results for input(s): TSH, T4TOTAL, FREET4, T3FREE, THYROIDAB in the last 72 hours. Anemia Panel: No results for input(s): VITAMINB12, FOLATE, FERRITIN, TIBC, IRON, RETICCTPCT in the last 72 hours. Urine analysis:    Component  Value Date/Time   COLORURINE YELLOW 03/13/2020 1808   APPEARANCEUR CLEAR 03/13/2020 1808   LABSPEC 1.018 03/13/2020 1808   PHURINE 5.0 03/13/2020 1808   GLUCOSEU NEGATIVE 03/13/2020 1808   HGBUR NEGATIVE 03/13/2020 1808   BILIRUBINUR NEGATIVE 03/13/2020 1808   KETONESUR 20 (A) 03/13/2020 1808   PROTEINUR NEGATIVE 03/13/2020 1808   NITRITE NEGATIVE 03/13/2020 1808   LEUKOCYTESUR NEGATIVE 03/13/2020 1808    Radiological Exams on Admission: DG Chest 2 View Result Date: 11/03/2023 CLINICAL DATA:  Shortness of breath EXAM: CHEST - 2 VIEW COMPARISON:  03/13/2020 FINDINGS: Cardiomegaly. Small bilateral pleural effusions with bibasilar atelectasis. No overt edema. No acute bony abnormality. IMPRESSION: Cardiomegaly. Small bilateral effusions with bibasilar atelectasis. Electronically  Signed   By: Franky Crease M.D.   On: 11/03/2023 19:19    EKG: Independently reviewed.  A-fib 90 bpm.  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 to further assist with management  Troponin elevation with CAD - No chest pain noted and likely due to demand ischemia - Prior cardiac catheterization 08/2017 with significant stenoses noted in LVEF 25%  Atrial fibrillation - Continue Coreg  - Refuses to use Eliquis  - Monitor on telemetry, currently rate controlled  Hypertension - Continue Coreg  and lisinopril    DVT prophylaxis: Lovenox  Code Status: Full Family Communication: None at bedside Disposition  Plan: Admit for CHF exacerbation Consults called: None Admission status: Observation, telemetry  Severity of Illness: The appropriate patient status for this patient is OBSERVATION. Observation status is judged to be reasonable and necessary in order to provide the required intensity of service to ensure the patient's safety. The patient's presenting symptoms, physical exam findings, and initial radiographic and laboratory data in the context of their medical condition is felt to place them at decreased risk for further clinical deterioration. Furthermore, it is anticipated that the patient will be medically stable for discharge from the hospital within 2 midnights of admission.    Brittanyann Wittner D Maree DO [facility]  If 7PM-7AM, please contact night-coverage [email or web address]  11/04/2023, 2:38 AM

## 2023-11-04 NOTE — Hospital Course (Addendum)
 Austin Wong was admitted to the hospital with the working diagnosis of heart failure exacerbation.   83 yo male with the past medical history of heart failure, coronary artery disease, hypertension, atrial fibrillation and ostearthritis who presented with dyspnea. He has not been taking apixaban  for the last 2 months prior to admission. On his initial physical examination his blood pressure was 132/106, HR 97, RR 19 and 02 saturation 97%.  Lungs with no wheezing or rhonchi, heart with S1 and S2 present and regular with no gallops or rubs, abdomen not distended, no lower extremity edema.   Na 136, K 4.1 Cl 100 bicarbonate 23, glucose 122 bun 18 cr 1.1 BNP 396  Wbc 6.9 hgb 13.9 plt 175  High sensitive troponin 284 and 301  D dimer 1,73  Sars COVID 19 negative Influenza negative  RSV negative   Chest radiograph with hyperinflation, no cardiomegaly, bilateral small pleural effusions, bilateral hilar vascular congestion.   EKG 90 bpm, left axis, left anterior fascicular block, qtc 452, atrial fibrillation rhythm with no significant ST segment or T wave changes.   Patient was placed on IV furosemide  for diuresis.  Echocardiogram with reduced LV systolic function.  09/09 improved volume status and tolerating well guideline directed medical therapy.

## 2023-11-04 NOTE — Assessment & Plan Note (Signed)
 Continue rate control with carvedilol . He has agreed to take apixaban  for anticoagulation.  Follow up with Cardiology as outpatient.

## 2023-11-04 NOTE — Plan of Care (Signed)
  Problem: Health Behavior/Discharge Planning: Goal: Ability to manage health-related needs will improve Outcome: Progressing   Problem: Clinical Measurements: Goal: Ability to maintain clinical measurements within normal limits will improve Outcome: Progressing   Problem: Health Behavior/Discharge Planning: Goal: Ability to manage health-related needs will improve Outcome: Progressing

## 2023-11-04 NOTE — Care Management Obs Status (Signed)
 MEDICARE OBSERVATION STATUS NOTIFICATION   Patient Details  Name: Austin Wong MRN: [id number] Date of Birth: [birth year]   Medicare Observation Status Notification Given:  Yes    Vonzell Arrie Sharps 11/04/2023, 9:41 AM

## 2023-11-04 NOTE — Discharge Instructions (Signed)

## 2023-11-04 NOTE — Assessment & Plan Note (Addendum)
 Echocardiogram with reduced LV systolic function EF 25 to 30%, global hypokinesis, LV cavity with moderate dilatation, mild LVH, RV systolic function preserved, LA with moderate dilatation, RA with severe dilatation, no significant valvular disease.   Urine output 1900 ml Systolic blood pressure 115 mmHg.   Volume status has improved, will give one total dose of 60 mg IV furosemide  today.  Medical therapy with carvedilol  and lisinopril .  Add spironolactone  and SGLT 2 inh.

## 2023-11-04 NOTE — Assessment & Plan Note (Signed)
 Renal function with serum cr at 1,0 with K at 3,3 and serum bicarbonate at 24  Na 137 and Mg 1,7   Will add Kcl 40 meq x2 and 2 g mag sulfate Continue diuresis  Follow up renal function and electrolytes in am.

## 2023-11-04 NOTE — Plan of Care (Signed)
°  Problem: Education: Goal: Knowledge of General Education information will improve Description: Including pain rating scale, medication(s)/side effects and non-pharmacologic comfort measures Outcome: Progressing   Problem: Health Behavior/Discharge Planning: Goal: Ability to manage health-related needs will improve Outcome: Progressing   Problem: Clinical Measurements: Goal: Ability to maintain clinical measurements within normal limits will improve Outcome: Progressing   Problem: Clinical Measurements: Goal: Ability to maintain clinical measurements within normal limits will improve Outcome: Progressing

## 2023-11-04 NOTE — Progress Notes (Signed)
 Echocardiogram 2D Echocardiogram has been performed.  Austin Wong 11/04/2023, 12:35 PM

## 2023-11-04 NOTE — Assessment & Plan Note (Signed)
Continue blood pressure control with carvedilol and lisinopril.

## 2023-11-05 ENCOUNTER — Telehealth (HOSPITAL_COMMUNITY): Payer: Self-pay | Admitting: Pharmacy Technician

## 2023-11-05 ENCOUNTER — Other Ambulatory Visit (HOSPITAL_COMMUNITY): Payer: Self-pay

## 2023-11-05 DIAGNOSIS — I5023 Acute on chronic systolic (congestive) heart failure: Secondary | ICD-10-CM | POA: Diagnosis not present

## 2023-11-05 DIAGNOSIS — E876 Hypokalemia: Secondary | ICD-10-CM

## 2023-11-05 DIAGNOSIS — I10 Essential (primary) hypertension: Secondary | ICD-10-CM | POA: Diagnosis not present

## 2023-11-05 DIAGNOSIS — I4821 Permanent atrial fibrillation: Secondary | ICD-10-CM | POA: Diagnosis not present

## 2023-11-05 LAB — BASIC METABOLIC PANEL WITH GFR
Anion gap: 12 (ref 5–15)
BUN: 21 mg/dL (ref 8–23)
CO2: 28 mmol/L (ref 22–32)
Calcium: 9 mg/dL (ref 8.9–10.3)
Chloride: 99 mmol/L (ref 98–111)
Creatinine, Ser: 1.38 mg/dL — ABNORMAL HIGH (ref 0.61–1.24)
GFR, Estimated: 50 mL/min — ABNORMAL LOW (ref >60)
Glucose, Bld: 140 mg/dL — ABNORMAL HIGH (ref 70–99)
Potassium: 3.9 mmol/L (ref 3.5–5.1)
Sodium: 139 mmol/L (ref 135–145)

## 2023-11-05 LAB — MAGNESIUM: Magnesium: 1.9 mg/dL (ref 1.7–2.4)

## 2023-11-05 MED ORDER — EMPAGLIFLOZIN 10 MG PO TABS
10.0000 mg | ORAL_TABLET | Freq: Every day | ORAL | 0 refills | Status: DC
  Filled 2023-11-05: qty 30, 30d supply, fill #0

## 2023-11-05 MED ORDER — FUROSEMIDE 20 MG PO TABS: 20.0000 mg | ORAL_TABLET | Freq: Every day | ORAL | Status: DC | PRN

## 2023-11-05 MED ORDER — APIXABAN 5 MG PO TABS: 5.0000 mg | ORAL_TABLET | Freq: Two times a day (BID) | ORAL | 0 refills | Status: DC

## 2023-11-05 MED ORDER — SPIRONOLACTONE 25 MG PO TABS
12.5000 mg | ORAL_TABLET | Freq: Every day | ORAL | 0 refills | Status: DC
  Filled 2023-11-05: qty 15, 30d supply, fill #0

## 2023-11-05 MED ORDER — FUROSEMIDE 20 MG PO TABS
20.0000 mg | ORAL_TABLET | Freq: Every day | ORAL | 0 refills | Status: DC | PRN
  Filled 2023-11-05: qty 30, 30d supply, fill #0

## 2023-11-05 NOTE — Progress Notes (Signed)
 OT Discharge Note  Patient Details Name: Austin Wong MRN: [id number] DOB: March 18, 1940   Cancelled Treatment:     Screen by PT Austin Wong no acute OT needs  Austin Wong 11/05/2023, 9:02 AM

## 2023-11-05 NOTE — TOC Transition Note (Addendum)
 Transition of Care [facility]) - Discharge Note   Patient Details  Name: Austin Wong MRN: [id number] Date of Birth: September 27, 1940  [facility] CM/SW Contact:  Waddell Barnie Rama, RN Phone Number: 11/05/2023, 1:55 PM   Clinical Narrative:    For dc today, he has transportation home, he states he has no needs.  Copay for eliquis  and jardiance  is 47.00.   per pt eval no f/u needed.     Barriers to Discharge: Continued Medical Work up   Patient Goals and CMS Choice Patient states their goals for this hospitalization and ongoing recovery are:: To go home          Discharge Placement                       Discharge Plan and Services Additional resources added to the After Visit Summary for     Discharge Planning Services: CM Consult                                 Social Drivers of Health (SDOH) Interventions SDOH Screenings   Food Insecurity: No Food Insecurity (11/04/2023)  Housing: Low Risk  (11/04/2023)  Transportation Needs: No Transportation Needs (11/04/2023)  Utilities: Not At Risk (11/04/2023)  Depression (PHQ2-9): Low Risk  (05/02/2023)  Social Connections: Unknown (11/04/2023)  Tobacco Use: Low Risk  (11/03/2023)     Readmission Risk Interventions     No data to display

## 2023-11-05 NOTE — Plan of Care (Signed)

## 2023-11-05 NOTE — Telephone Encounter (Signed)
 Patient Product/process development scientist completed.    The patient is insured through [employer]/ [employer]. Patient has Medicare and is not eligible for a copay card, but may be able to apply for patient assistance or Medicare RX Payment Plan (Patient Must reach out to their plan, if eligible for payment plan), if available.    Ran test claim for Eliquis  5 mg and the current 30 day co-pay is $47.00.  Ran test claim for Jardiance  10 mg and the current 30 day co-pay is $47.00.  This test claim was processed through [facility]- copay amounts may vary at other pharmacies due to pharmacy/plan contracts, or as the patient moves through the different stages of their insurance plan.     Reyes Sharps, CPHT Pharmacy Technician III Certified Patient Advocate [facility] Patient Advocate Team Direct Number: [phone]  Fax: [phone]

## 2023-11-05 NOTE — Plan of Care (Signed)
 Problem: Education: Goal: Knowledge of General Education information will improve Description: Including pain rating scale, medication(s)/side effects and non-pharmacologic comfort measures 11/05/2023 1521 by Norine Colman BIRCH, RN Outcome: Adequate for Discharge 11/05/2023 1521 by Norine Colman BIRCH, RN Outcome: Adequate for Discharge 11/05/2023 1515 by Norine Colman BIRCH, RN Outcome: Progressing   Problem: Health Behavior/Discharge Planning: Goal: Ability to manage health-related needs will improve 11/05/2023 1521 by Norine Colman BIRCH, RN Outcome: Adequate for Discharge 11/05/2023 1521 by Norine Colman BIRCH, RN Outcome: Adequate for Discharge 11/05/2023 1515 by Norine Colman BIRCH, RN Outcome: Progressing   Problem: Clinical Measurements: Goal: Ability to maintain clinical measurements within normal limits will improve 11/05/2023 1521 by Norine Colman BIRCH, RN Outcome: Adequate for Discharge 11/05/2023 1521 by Norine Colman BIRCH, RN Outcome: Adequate for Discharge 11/05/2023 1515 by Norine Colman BIRCH, RN Outcome: Progressing Goal: Will remain free from infection 11/05/2023 1521 by Norine Colman BIRCH, RN Outcome: Adequate for Discharge 11/05/2023 1521 by Norine Colman BIRCH, RN Outcome: Adequate for Discharge 11/05/2023 1515 by Norine Colman BIRCH, RN Outcome: Progressing Goal: Diagnostic test results will improve 11/05/2023 1521 by Norine Colman BIRCH, RN Outcome: Adequate for Discharge 11/05/2023 1521 by Norine Colman BIRCH, RN Outcome: Adequate for Discharge 11/05/2023 1515 by Norine Colman BIRCH, RN Outcome: Progressing Goal: Respiratory complications will improve 11/05/2023 1521 by Norine Colman BIRCH, RN Outcome: Adequate for Discharge 11/05/2023 1521 by Norine Colman BIRCH, RN Outcome: Adequate for Discharge 11/05/2023 1515 by Norine Colman BIRCH, RN Outcome: Progressing Goal: Cardiovascular complication will be avoided 11/05/2023 1521 by Norine Colman BIRCH, RN Outcome: Adequate for Discharge 11/05/2023 1521 by Norine Colman BIRCH, RN Outcome: Adequate for Discharge 11/05/2023 1515 by Norine Colman BIRCH, RN Outcome: Progressing   Problem: Activity: Goal: Risk for activity intolerance will decrease 11/05/2023 1521 by Norine Colman BIRCH, RN Outcome: Adequate for Discharge 11/05/2023 1521 by Norine Colman BIRCH, RN Outcome: Adequate for Discharge 11/05/2023 1515 by Norine Colman BIRCH, RN Outcome: Progressing   Problem: Nutrition: Goal: Adequate nutrition will be maintained 11/05/2023 1521 by Norine Colman BIRCH, RN Outcome: Adequate for Discharge 11/05/2023 1521 by Norine Colman BIRCH, RN Outcome: Adequate for Discharge 11/05/2023 1515 by Norine Colman BIRCH, RN Outcome: Progressing   Problem: Coping: Goal: Level of anxiety will decrease 11/05/2023 1521 by Norine Colman BIRCH, RN Outcome: Adequate for Discharge 11/05/2023 1521 by Norine Colman BIRCH, RN Outcome: Adequate for Discharge 11/05/2023 1515 by Norine Colman BIRCH, RN Outcome: Progressing   Problem: Elimination: Goal: Will not experience complications related to bowel motility 11/05/2023 1521 by Norine Colman BIRCH, RN Outcome: Adequate for Discharge 11/05/2023 1521 by Norine Colman BIRCH, RN Outcome: Adequate for Discharge 11/05/2023 1515 by Norine Colman BIRCH, RN Outcome: Progressing Goal: Will not experience complications related to urinary retention 11/05/2023 1521 by Norine Colman BIRCH, RN Outcome: Adequate for Discharge 11/05/2023 1521 by Norine Colman BIRCH, RN Outcome: Adequate for Discharge 11/05/2023 1515 by Norine Colman BIRCH, RN Outcome: Progressing   Problem: Pain Managment: Goal: General experience of comfort will improve and/or be controlled 11/05/2023 1521 by Norine Colman BIRCH, RN Outcome: Adequate for Discharge 11/05/2023 1521 by Norine Colman BIRCH, RN Outcome: Adequate for Discharge 11/05/2023 1515 by Norine Colman BIRCH, RN Outcome: Progressing   Problem: Safety: Goal: Ability to remain free from injury will improve 11/05/2023 1521 by Norine Colman BIRCH, RN Outcome: Adequate for Discharge 11/05/2023 1521 by Norine Colman BIRCH, RN Outcome: Adequate for Discharge 11/05/2023 1515 by Norine Colman BIRCH, RN Outcome: Progressing   Problem: Skin Integrity: Goal: Risk for impaired  skin integrity will decrease 11/05/2023 1521 by Norine Colman BIRCH, RN Outcome: Adequate for Discharge 11/05/2023 1521 by Norine Colman BIRCH, RN Outcome: Adequate for Discharge 11/05/2023 1515 by Norine Colman BIRCH, RN Outcome: Progressing   Problem: Education: Goal: Ability to demonstrate management of disease process will improve 11/05/2023 1521 by Norine Colman BIRCH, RN Outcome: Adequate for Discharge 11/05/2023 1521 by Norine Colman BIRCH, RN Outcome: Adequate for Discharge 11/05/2023 1515 by Norine Colman BIRCH, RN Outcome: Progressing Goal: Ability to verbalize understanding of medication therapies will improve 11/05/2023 1521 by Norine Colman BIRCH, RN Outcome: Adequate for Discharge 11/05/2023 1521 by Norine Colman BIRCH, RN Outcome: Adequate for Discharge 11/05/2023 1515 by Norine Colman BIRCH, RN Outcome: Progressing Goal: Individualized Educational Video(s) 11/05/2023 1521 by Norine Colman BIRCH, RN Outcome: Adequate for Discharge 11/05/2023 1521 by Norine Colman BIRCH, RN Outcome: Adequate for Discharge 11/05/2023 1515 by Norine Colman BIRCH, RN Outcome: Progressing   Problem: Activity: Goal: Capacity to carry out activities will improve 11/05/2023 1521 by Norine Colman BIRCH, RN Outcome: Adequate for Discharge 11/05/2023 1521 by Norine Colman BIRCH, RN Outcome: Adequate for Discharge 11/05/2023 1515 by Norine Colman BIRCH, RN Outcome: Progressing   Problem: Cardiac: Goal: Ability to achieve and maintain adequate cardiopulmonary perfusion will improve 11/05/2023 1521 by Norine Colman BIRCH, RN Outcome: Adequate for Discharge 11/05/2023 1521 by Norine Colman BIRCH, RN Outcome: Adequate for Discharge 11/05/2023 1515 by Norine Colman BIRCH, RN Outcome: Progressing

## 2023-11-05 NOTE — Discharge Summary (Signed)
 Physician Discharge Summary   Patient: Austin Wong MRN: [id number] DOB: October 06, 1940  Admit date:     11/03/2023  Discharge date: 11/05/23  Discharge Physician: Elidia Sieving Natan Hartog   PCP: Gil Greig BRAVO, NP   Recommendations at discharge:    Patient has greed to resume apixaban  for stroke prophylaxis, in the setting of chronic atrial fibrillation.  Patient placed on guideline directed medical therapy with carvedilol , lisinopril , empagliflozin  and spironolactone  Loop diuretic as needed in case of volume overload, weight gain 2 to 3 lbs increase in 24 hrs or 5 lbs in 7 days.  Follow up renal function and electrolytes in 7 days as outptient Follow up with Greig Gil NP in 7 to 10 days Follow up with Cardiology as scheduled.   I spoke with patient's daughter at the bedside, we talked in detail about patient's condition, plan of care and prognosis and all questions were addressed.   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 [facility] Course: Austin Wong was admitted to the hospital with the working diagnosis of heart failure exacerbation.   83 yo male with the past medical history of heart failure, coronary artery disease, hypertension, atrial fibrillation and ostearthritis who presented with dyspnea. He has not been taking apixaban  for the last 2 months prior to admission. On his initial physical examination his blood pressure was 132/106, HR 97, RR 19 and 02 saturation 97%.  Lungs with no wheezing or rhonchi, heart with S1 and S2 present and regular with no gallops or rubs, abdomen not distended, no lower extremity edema.   Na 136, K 4.1 Cl 100 bicarbonate 23, glucose 122 bun 18 cr 1.1 BNP 396   Wbc 6.9 hgb 13.9 plt 175  High sensitive troponin 284 and 301  D dimer 1,73  Sars COVID 19 negative Influenza negative  RSV negative   Chest radiograph with hyperinflation, no cardiomegaly, bilateral small pleural effusions, bilateral hilar vascular congestion.   EKG 90 bpm, left axis, left anterior fascicular block, qtc 452, atrial fibrillation rhythm with no significant ST segment or T wave changes.   Patient was placed on IV furosemide  for diuresis.  Echocardiogram with reduced LV systolic function.  09/09 improved volume status and tolerating well guideline directed medical therapy.   Assessment and Plan: * Acute on chronic systolic CHF (congestive heart failure) (HCC) Echocardiogram with reduced LV systolic function EF 25 to 30%, global hypokinesis, LV cavity with moderate dilatation, mild LVH, RV systolic function preserved, LA with moderate dilatation, RA with severe dilatation, no significant valvular disease.   Patient was placed on furosemide  IV for diuresis, negative fluid balance was achieved, with significant improvement in his symptoms.  He lost about 5 Kg during this hospitalization.   Medical therapy with carvedilol  and lisinopril .  Added spironolactone  and SGLT 2 inh with good toleration. Use loop diuretic as needed.   Essential hypertension Continue blood pressure control with carvedilol  and lisinopril .   Permanent atrial fibrillation Continue rate control with carvedilol . He has agreed to take apixaban  for anticoagulation.  Follow up with Cardiology as outpatient.   Hypokalemia At the time of discharge his serum cr is up to 1,38, in the past he has been up to 1,2 to 1.1.  His electrolytes have been corrected.   At the time of discharge his bicarbonate is 28, Na 139 and K at 3,9  Plan to follow up renal function and electrolytes as outpatient in 7 days.   Memory changes Follow up as outpatient.   Osteoarthritis involving multiple joints on both sides  of body Follow up as outpatient        Consultants: none  Procedures performed: none   Disposition: Home Diet recommendation:  Cardiac diet DISCHARGE MEDICATION: Allergies as of 11/05/2023       Reactions   Iodine    Metoprolol Tartrate Nausea Only        Medication List     STOP taking these medications    PROBIOTIC ACIDOPHILUS PO       TAKE these medications    apixaban  5 MG Tabs tablet Commonly known as: ELIQUIS  Take 1 tablet (5 mg total) by mouth 2 (two) times daily.   ascorbic acid 500 MG tablet Commonly known as: VITAMIN C Take 500 mg by mouth 2 (two) times daily.   carvedilol  6.25 MG tablet Commonly known as: COREG  Take 1 tablet (6.25 mg total) by mouth 2 (two) times daily with a meal.   CoQ10 100 MG Caps Take 1 tablet by mouth daily.   empagliflozin  10 MG Tabs tablet Commonly known as: JARDIANCE  Take 1 tablet (10 mg total) by mouth daily. Start taking on: November 06, 2023   FISH OIL CONCENTRATE PO Take 1,600 mg by mouth daily in the afternoon.   furosemide  20 MG tablet Commonly known as: LASIX  Take 1 tablet (20 mg total) by mouth daily as needed for edema or fluid (take in case of weight gain 2 to 3 lbs in 24 hrs or 5 lbs in 7 days.).   lisinopril  5 MG tablet Commonly known as: ZESTRIL  Take 1 tablet (5 mg total) by mouth daily.   spironolactone  25 MG tablet Commonly known as: ALDACTONE  Take 0.5 tablets (12.5 mg total) by mouth daily. Start taking on: November 06, 2023   VITAMIN B COMPLEX PO Take 1 tablet by mouth daily.   Vitamin D3 125 MCG/ML Liqd Place 4 drops under the tongue daily.        Discharge Exam: Filed Weights   11/04/23 0009 11/04/23 0820 11/05/23 0500  Weight: 68.9 kg 68.9 kg 63.2 kg   BP 114/76 (BP Location: Left Arm)   Pulse 86   Temp 98 F (36.7 C) (Oral)   Resp 18   Ht 5' 11 (1.803 m)   Wt 63.2 kg   SpO2 98%   BMI 19.44 kg/m   Patient is feeling better, dyspnea has improved, no chest pain, no  PND, orthopnea or lower extremity edema  Neurology awake and alert ENT with mild pallor Cardiovascular with S1 and S2 present, irregularly irregular with no gallops, rubs or murmurs Respiratory with no rales or wheezing Abdomen with no distention  No lower extremity edema    Condition at discharge: stable  The results of significant diagnostics from this hospitalization (including imaging, microbiology, ancillary and laboratory) are listed below for reference.   Imaging Studies: ECHOCARDIOGRAM COMPLETE Result Date: 11/04/2023    ECHOCARDIOGRAM REPORT   Patient Name:   Austin Wong Date of Exam: 11/04/2023 Medical Rec #:  [id number]     Height:       71.0 in Accession #:    [id number]    Weight:       152.0 lb Date of Birth:  01-23-1941  BSA:          1.876 m Patient Age:    83 years      BP:           118/80 mmHg Patient Gender: M             HR:           64 bpm. Exam Location:  Inpatient Procedure: 2D Echo, Cardiac Doppler and Color Doppler (Both Spectral and Color            Flow Doppler were utilized during procedure). Indications:    CHF-Acute Systolic I50.21  History:        Patient has prior history of Echocardiogram examinations, most                 recent 12/02/2017. CHF and Cardiomyopathy, CKD, Arrythmias:Atrial                 Fibrillation; Risk Factors:Hypertension.  Sonographer:    Thea Norlander RCS Referring Phys: [id number] PRATIK D [facility] IMPRESSIONS  1. Left ventricular ejection fraction, by estimation, is 25 to 30%. The left ventricle has severely decreased function. The left ventricle demonstrates global hypokinesis. The left ventricular internal cavity size was moderately dilated. There is mild left ventricular hypertrophy. Left ventricular diastolic parameters are indeterminate.  2. Right ventricular systolic function is normal. The right ventricular size is normal.  3. Left atrial size was moderately dilated.  4. Right atrial size was severely dilated.  5. The mitral valve is  abnormal. Trivial mitral valve regurgitation. No evidence of mitral stenosis.  6. The aortic valve is tricuspid. There is moderate calcification of the aortic valve. Aortic valve regurgitation is not visualized. Aortic valve sclerosis is present, with no evidence of aortic valve stenosis.  7. Aortic dilatation noted. There is moderate dilatation of the ascending aorta, measuring 41 mm.  8. The inferior vena cava is dilated in size with >50% respiratory variability, suggesting right atrial pressure of 8 mmHg. FINDINGS  Left Ventricle: Left ventricular ejection fraction, by estimation, is 25 to 30%. The left ventricle has severely decreased function. The left ventricle demonstrates global hypokinesis. Strain was performed and the global longitudinal strain is indeterminate. The left ventricular internal cavity size was moderately dilated. There is mild left ventricular hypertrophy. Left ventricular diastolic parameters are indeterminate. Right Ventricle: The right ventricular size is normal. No increase in right ventricular wall thickness. Right ventricular systolic function is normal. Left Atrium: Left atrial size was moderately dilated. Right Atrium: Right atrial size was severely dilated. Pericardium: There is no evidence of pericardial effusion. Mitral Valve: The mitral valve is abnormal. There is mild thickening of the mitral valve leaflet(s). There is mild calcification of the mitral valve leaflet(s). Mild mitral annular calcification. Trivial mitral valve regurgitation. No evidence of mitral valve stenosis. Tricuspid Valve: The tricuspid valve is normal in structure. Tricuspid valve regurgitation is mild . No evidence of tricuspid stenosis. Aortic Valve: The aortic valve is tricuspid. There is moderate calcification of the aortic valve. Aortic valve regurgitation is not visualized. Aortic valve sclerosis is present, with no evidence of aortic valve stenosis. Aortic valve peak gradient measures 4.3 mmHg.  Pulmonic Valve: The pulmonic valve was normal in structure. Pulmonic valve regurgitation is mild. No evidence of pulmonic stenosis. Aorta: Aortic dilatation noted. There is moderate dilatation of the ascending aorta, measuring 41 mm. Venous: The inferior vena cava is dilated in size with greater than 50% respiratory variability, suggesting right atrial pressure of 8  mmHg. IAS/Shunts: No atrial level shunt detected by color flow Doppler. Additional Comments: 3D was performed not requiring image post processing on an independent workstation and was indeterminate.  LEFT VENTRICLE PLAX 2D LVIDd:         4.70 cm     Diastology LVIDs:         4.10 cm     LV e' medial:    4.46 cm/s LV PW:         1.30 cm     LV E/e' medial:  17.5 LV IVS:        1.20 cm     LV e' lateral:   8.59 cm/s LVOT diam:     2.30 cm     LV E/e' lateral: 9.1 LV SV:         57 LV SV Index:   30 LVOT Area:     4.15 cm  LV Volumes (MOD) LV vol d, MOD A2C: 97.0 ml LV vol d, MOD A4C: 86.7 ml LV vol s, MOD A2C: 69.4 ml LV vol s, MOD A4C: 60.2 ml LV SV MOD A2C:     27.6 ml LV SV MOD A4C:     86.7 ml LV SV MOD BP:      24.6 ml RIGHT VENTRICLE            IVC RV S prime:     8.16 cm/s  IVC diam: 2.25 cm TAPSE (M-mode): 1.5 cm LEFT ATRIUM             Index        RIGHT ATRIUM           Index LA diam:        4.30 cm 2.29 cm/m   RA Area:     31.90 cm LA Vol (A2C):   95.7 ml 51.00 ml/m  RA Volume:   117.00 ml 62.35 ml/m LA Vol (A4C):   74.9 ml 39.92 ml/m LA Biplane Vol: 90.8 ml 48.39 ml/m  AORTIC VALVE AV Area (Vmax): 2.86 cm AV Vmax:        103.60 cm/s AV Peak Grad:   4.3 mmHg LVOT Vmax:      71.30 cm/s LVOT Vmean:     46.600 cm/s LVOT VTI:       0.136 m  AORTA Ao Root diam: 3.70 cm Ao Asc diam:  4.10 cm MITRAL VALVE MV Area (PHT): 3.85 cm    SHUNTS MV Decel Time: 197 msec    Systemic VTI:  0.14 m MV E velocity: 78.10 cm/s  Systemic Diam: 2.30 cm MV A velocity: 37.70 cm/s MV E/A ratio:  2.07 Maude Emmer MD Electronically signed by Maude Emmer MD  Signature Date/Time: 11/04/2023/12:43:24 PM    Final    DG Chest 2 View Result Date: 11/03/2023 CLINICAL DATA:  Shortness of breath EXAM: CHEST - 2 VIEW COMPARISON:  03/13/2020 FINDINGS: Cardiomegaly. Small bilateral pleural effusions with bibasilar atelectasis. No overt edema. No acute bony abnormality. IMPRESSION: Cardiomegaly. Small bilateral effusions with bibasilar atelectasis. Electronically Signed   By: Franky Crease M.D.   On: 11/03/2023 19:19    Microbiology: Results for orders placed or performed during the hospital encounter of 11/03/23  Resp panel by RT-PCR (RSV, Flu A&B, Covid) Anterior Nasal Swab     Status: None   Collection Time: 11/03/23  7:07 PM   Specimen: Anterior Nasal Swab  Result Value Ref Range Status   SARS Coronavirus 2 by RT PCR NEGATIVE NEGATIVE Final  Influenza A by PCR NEGATIVE NEGATIVE Final   Influenza B by PCR NEGATIVE NEGATIVE Final    Comment: (NOTE) The Xpert Xpress SARS-CoV-2/FLU/RSV plus assay is intended as an aid in the diagnosis of influenza from Nasopharyngeal swab specimens and should not be used as a sole basis for treatment. Nasal washings and aspirates are unacceptable for Xpert Xpress SARS-CoV-2/FLU/RSV testing.  Fact Sheet for Patients: BloggerCourse.com  Fact Sheet for Healthcare Providers: SeriousBroker.it  This test is not yet approved or cleared by the [place]  FDA and has been authorized for detection and/or diagnosis of SARS-CoV-2 by FDA under an Emergency Use Authorization (EUA). This EUA will remain in effect (meaning this test can be used) for the duration of the COVID-19 declaration under Section 564(b)(1) of the Act, 21 U.S.C. section 360bbb-3(b)(1), unless the authorization is terminated or revoked.     Resp Syncytial Virus by PCR NEGATIVE NEGATIVE Final    Comment: (NOTE) Fact Sheet for Patients: BloggerCourse.com  Fact Sheet for Healthcare  Providers: SeriousBroker.it  This test is not yet approved or cleared by the [place]  FDA and has been authorized for detection and/or diagnosis of SARS-CoV-2 by FDA under an Emergency Use Authorization (EUA). This EUA will remain in effect (meaning this test can be used) for the duration of the COVID-19 declaration under Section 564(b)(1) of the Act, 21 U.S.C. section 360bbb-3(b)(1), unless the authorization is terminated or revoked.  Performed at [facility], [address], [place], [place] [address]     Labs: CBC: Recent Labs  Lab 11/03/23 1826 11/03/23 1837 11/04/23 0249  WBC 6.9  --  7.0  HGB 13.9 14.6 13.7  HCT 42.2 43.0 40.5  MCV 94.4  --  92.7  PLT 175  --  154   Basic Metabolic Panel: Recent Labs  Lab 11/03/23 1826 11/03/23 1837 11/04/23 0249 11/04/23 1206 11/05/23 0240  NA 136 137  --  137 139  K 4.1 4.2  --  3.3* 3.9  CL 100 101  --  98 99  CO2 23  --   --  24 28  GLUCOSE 122* 126*  --  211* 140*  BUN 18 18  --  16 21  CREATININE 1.10 1.20 1.12 1.03 1.38*  CALCIUM 9.4  --   --  9.1 9.0  MG  --   --  1.7 1.7 1.9   Liver Function Tests: No results for input(s): AST, ALT, ALKPHOS, BILITOT, PROT, ALBUMIN  in the last 168 hours. CBG: No results for input(s): GLUCAP in the last 168 hours.  Discharge time spent: greater than 30 minutes.  Signed: Elidia Toribio Furnace, MD [facility] 11/05/2023

## 2023-11-05 NOTE — Evaluation (Signed)
 Physical Therapy Brief Evaluation and Discharge Note Patient Details Name: Austin Wong MRN: [id number] DOB: 1940-08-10 Today's Date: 11/05/2023   History of Present Illness  83 yo male adm 11/03/23 with dyspnea due to acute on chronic CHF . PMHx: CHF, Afib, HTN, CKD, NICM  Clinical Impression  Pt pleasant and states he prefers organic, homeopathic remedies for things and does not like taking medication. Pt lives alone is active and reports limited sodium intake. Pt does not weigh daily and educated for walking program, daily weights and monitoring HR/SPO2 as pt has home pulse ox but does not use. Pt with significant desire to return home soon and able to repeat education and rationale. No further acute therapy needs with pt independent with mobility.        PT Assessment Patient does not need any further PT services  Assistance Needed at Discharge  None    Equipment Recommendations None recommended by PT  Recommendations for Other Services       Precautions/Restrictions Precautions Precautions: None        Mobility  Bed Mobility Rolling: Modified independent (Device/Increase time)        Transfers Overall transfer level: Independent                      Ambulation/Gait Ambulation/Gait assistance: Independent Gait Distance (Feet): 400 Feet Assistive device: None Gait Pattern/deviations: WFL(Within Functional Limits) Gait Speed: Pace WFL    Home Activity Instructions    Stairs Stairs: Yes Stairs assistance: Modified independent (Device/Increase time) Stair Management: One rail Left, Alternating pattern, Forwards Number of Stairs: 4 General stair comments: minor use of rail with good stability on stairs  Modified Rankin (Stroke Patients Only)        Balance Overall balance assessment: No apparent balance deficits (not formally assessed)                        Pertinent Vitals/Pain PT - Brief Vital Signs All Vital Signs Stable:  No Exception to Vital Signs Stable: HR 115-123 majority of gait with spike of AFib to 140 non-sustained, HR 84 at rest Pain Assessment Pain Assessment: No/denies pain     Home Living Family/patient expects to be discharged to:: Private residence Living Arrangements: Alone Available Help at Discharge: Family;Available PRN/intermittently Home Environment: Stairs to enter  Stairs-Number of Steps: 2 Home Equipment: None        Prior Function Level of Independence: Independent      UE/LE Assessment   UE ROM/Strength/Tone/Coordination: WFL    LE ROM/Strength/Tone/Coordination: [facility]      Communication   Communication Communication: Impaired Factors Affecting Communication: Hearing impaired;Other (comment) (hearing aids)     Cognition Overall Cognitive Status: Appears within functional limits for tasks assessed/performed       General Comments      Exercises     Assessment/Plan    PT Problem List         PT Visit Diagnosis Other abnormalities of gait and mobility (R26.89)    No Skilled PT All education completed;Patient is modified independent with all activity/mobility   Co-evaluation                AMPAC 6 Clicks Help needed turning from your back to your side while in a flat bed without using bedrails?: None Help needed moving from lying on your back to sitting on the side of a flat bed without using bedrails?: None Help needed moving to and from a  bed to a chair (including a wheelchair)?: None Help needed standing up from a chair using your arms (e.g., wheelchair or bedside chair)?: None Help needed to walk in hospital room?: None Help needed climbing 3-5 steps with a railing? : None 6 Click Score: 24      End of Session   Activity Tolerance: Patient tolerated treatment well Patient left: in bed;with call bell/phone within reach (pt denied up to chair) Nurse Communication: Mobility status PT Visit Diagnosis: Other abnormalities of gait and  mobility (R26.89)     Time: 9168-9148 PT Time Calculation (min) (ACUTE ONLY): 20 min  Charges:   PT Evaluation $PT Eval Low Complexity: 1 Low      Deddrick Saindon P, PT Acute Rehabilitation Services Office: [phone]   Lenoard NOVAK Kang Ishida  11/05/2023, 9:24 AM

## 2023-11-05 NOTE — Progress Notes (Signed)
 Mobility Specialist Progress Note:    11/05/23 1054  Mobility  Activity Ambulated independently  Level of Assistance Independent  Assistive Device None  Distance Ambulated (ft) 25 ft  Activity Response Tolerated well  Mobility Referral Yes  Mobility visit 1 Mobility  Mobility Specialist Start Time (ACUTE ONLY) 1054  Mobility Specialist Stop Time (ACUTE ONLY) 1100  Mobility Specialist Time Calculation (min) (ACUTE ONLY) 6 min   Received pt ambulating in room looking for object. No c/o any symptoms. Pt moving well and ambulating well appreciative of the check in and session. Doesn't wish to have any mobility after today. Left pt in room w/ all needs met.   Venetia Keel Mobility Specialist Please Neurosurgeon or Rehab Office at [phone]

## 2023-11-05 NOTE — TOC Initial Note (Signed)
 Transition of Care [facility]) - Initial/Assessment Note    Patient Details  Name: Austin Wong MRN: [id number] Date of Birth: January 22, 1941  Transition of Care [facility]) CM/SW Contact:    Luise JAYSON Pan, LCSWA Phone Number: 11/05/2023, 11:26 AM  Clinical Narrative:   Patient reports he lives at home alone. Patient states he has no DME or HH hx. Patient states he has transportation for time of discharge. Patient has a PCP and uses [facility] in [place].    No ICM (inpatient care management) needs at this time. ICM will continue to monitor patient in progression. Please place consult/reach out if any needs arise.            Expected Discharge Plan: Home/Self Care Barriers to Discharge: Continued Medical Work up   Patient Goals and CMS Choice Patient states their goals for this hospitalization and ongoing recovery are:: To go home          Expected Discharge Plan and Services   Discharge Planning Services: CM Consult   Living arrangements for the past 2 months: Single Family Home                                      Prior Living Arrangements/Services Living arrangements for the past 2 months: Single Family Home Lives with:: Self Patient language and need for interpreter reviewed:: Yes Do you feel safe going back to the place where you live?: Yes      Need for Family Participation in Patient Care: No (Comment) Care giver support system in place?: No (comment)   Criminal Activity/Legal Involvement Pertinent to Current Situation/Hospitalization: No - Comment as needed  Activities of Daily Living   ADL Screening (condition at time of admission) Independently performs ADLs?: No Does the patient have a NEW difficulty with bathing/dressing/toileting/self-feeding that is expected to last >3 days?: No Does the patient have a NEW difficulty with getting in/out of bed, walking, or climbing stairs that is expected to last >3 days?: No Does the patient have a NEW  difficulty with communication that is expected to last >3 days?: No Is the patient deaf or have difficulty hearing?: Yes Does the patient have difficulty seeing, even when wearing glasses/contacts?: No Does the patient have difficulty concentrating, remembering, or making decisions?: No  Permission Sought/Granted Permission sought to share information with : Family Supports Permission granted to share information with : Yes, Verbal Permission Granted  Share Information with NAME: Thompson,Lori     Permission granted to share info w Relationship: daughter  Permission granted to share info w Contact Information: [phone]  Emotional Assessment Appearance:: Appears stated age Attitude/Demeanor/Rapport: Engaged Affect (typically observed): Pleasant Orientation: : Oriented to Self, Oriented to Place, Oriented to  Time, Oriented to Situation Alcohol / Substance Use: Not Applicable Psych Involvement: No (comment)  Admission diagnosis:  Dyspnea on exertion [R06.09] Acute CHF (HCC) [I50.9] Acute on chronic combined systolic and diastolic congestive heart failure (HCC) [I50.43] Heart failure (HCC) [I50.9] Patient Active Problem List   Diagnosis Date Noted   Heart failure (HCC) 11/04/2023   Hypokalemia 11/04/2023   Acute on chronic systolic CHF (congestive heart failure) (HCC) 11/03/2023   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 [place] [address] Phone: [phone] Fax: [phone]  [facility] - [address] [place] [address] Phone: [phone] Fax: [phone]  [id number] - [place], [place] - [address] [place] [address] Phone: [phone] Fax: [phone]     Social Drivers of Health (SDOH) Social History: SDOH Screenings   Food Insecurity: No Food Insecurity (11/04/2023)  Housing: Low Risk  (11/04/2023)  Transportation Needs: No Transportation Needs (11/04/2023)  Utilities: Not At Risk (11/04/2023)  Depression (PHQ2-9): Low Risk  (05/02/2023)  Social Connections: Unknown (11/04/2023)  Tobacco Use: Low Risk  (11/03/2023)   SDOH Interventions:     Readmission Risk Interventions     No data to display

## 2023-11-05 NOTE — Plan of Care (Signed)
 Problem: Education: Goal: Knowledge of General Education information will improve Description: Including pain rating scale, medication(s)/side effects and non-pharmacologic comfort measures 11/05/2023 1521 by Norine Colman BIRCH, RN Outcome: Adequate for Discharge 11/05/2023 1515 by Norine Colman BIRCH, RN Outcome: Progressing   Problem: Health Behavior/Discharge Planning: Goal: Ability to manage health-related needs will improve 11/05/2023 1521 by Norine Colman BIRCH, RN Outcome: Adequate for Discharge 11/05/2023 1515 by Norine Colman BIRCH, RN Outcome: Progressing   Problem: Clinical Measurements: Goal: Ability to maintain clinical measurements within normal limits will improve 11/05/2023 1521 by Norine Colman BIRCH, RN Outcome: Adequate for Discharge 11/05/2023 1515 by Norine Colman BIRCH, RN Outcome: Progressing Goal: Will remain free from infection 11/05/2023 1521 by Norine Colman BIRCH, RN Outcome: Adequate for Discharge 11/05/2023 1515 by Norine Colman BIRCH, RN Outcome: Progressing Goal: Diagnostic test results will improve 11/05/2023 1521 by Norine Colman BIRCH, RN Outcome: Adequate for Discharge 11/05/2023 1515 by Norine Colman BIRCH, RN Outcome: Progressing Goal: Respiratory complications will improve 11/05/2023 1521 by Norine Colman BIRCH, RN Outcome: Adequate for Discharge 11/05/2023 1515 by Norine Colman BIRCH, RN Outcome: Progressing Goal: Cardiovascular complication will be avoided 11/05/2023 1521 by Norine Colman BIRCH, RN Outcome: Adequate for Discharge 11/05/2023 1515 by Norine Colman BIRCH, RN Outcome: Progressing   Problem: Activity: Goal: Risk for activity intolerance will decrease 11/05/2023 1521 by Norine Colman BIRCH, RN Outcome: Adequate for Discharge 11/05/2023 1515 by Norine Colman BIRCH, RN Outcome: Progressing   Problem: Nutrition: Goal: Adequate nutrition will be maintained 11/05/2023 1521 by Norine Colman BIRCH, RN Outcome:  Adequate for Discharge 11/05/2023 1515 by Norine Colman BIRCH, RN Outcome: Progressing   Problem: Coping: Goal: Level of anxiety will decrease 11/05/2023 1521 by Norine Colman BIRCH, RN Outcome: Adequate for Discharge 11/05/2023 1515 by Norine Colman BIRCH, RN Outcome: Progressing   Problem: Elimination: Goal: Will not experience complications related to bowel motility 11/05/2023 1521 by Norine Colman BIRCH, RN Outcome: Adequate for Discharge 11/05/2023 1515 by Norine Colman BIRCH, RN Outcome: Progressing Goal: Will not experience complications related to urinary retention 11/05/2023 1521 by Norine Colman BIRCH, RN Outcome: Adequate for Discharge 11/05/2023 1515 by Norine Colman BIRCH, RN Outcome: Progressing   Problem: Pain Managment: Goal: General experience of comfort will improve and/or be controlled 11/05/2023 1521 by Norine Colman BIRCH, RN Outcome: Adequate for Discharge 11/05/2023 1515 by Norine Colman BIRCH, RN Outcome: Progressing   Problem: Safety: Goal: Ability to remain free from injury will improve 11/05/2023 1521 by Norine Colman BIRCH, RN Outcome: Adequate for Discharge 11/05/2023 1515 by Norine Colman BIRCH, RN Outcome: Progressing   Problem: Skin Integrity: Goal: Risk for impaired skin integrity will decrease 11/05/2023 1521 by Norine Colman BIRCH, RN Outcome: Adequate for Discharge 11/05/2023 1515 by Norine Colman BIRCH, RN Outcome: Progressing   Problem: Education: Goal: Ability to demonstrate management of disease process will improve 11/05/2023 1521 by Norine Colman BIRCH, RN Outcome: Adequate for Discharge 11/05/2023 1515 by Norine Colman BIRCH, RN Outcome: Progressing Goal: Ability to verbalize understanding of medication therapies will improve 11/05/2023 1521 by Norine Colman BIRCH, RN Outcome: Adequate for Discharge 11/05/2023 1515 by Norine Colman BIRCH, RN Outcome: Progressing Goal: Individualized Educational Video(s) 11/05/2023 1521  by Norine Colman BIRCH, RN Outcome: Adequate for Discharge 11/05/2023 1515 by Norine Colman BIRCH, RN Outcome: Progressing   Problem: Activity: Goal: Capacity to carry out activities will improve 11/05/2023 1521 by Norine Colman BIRCH, RN Outcome: Adequate for Discharge 11/05/2023 1515 by Norine Colman BIRCH, RN Outcome: Progressing   Problem: Cardiac: Goal: Ability to achieve and maintain  adequate cardiopulmonary perfusion will improve 11/05/2023 1521 by Norine Colman BIRCH, RN Outcome: Adequate for Discharge 11/05/2023 1515 by Norine Colman BIRCH, RN Outcome: Progressing

## 2023-11-05 NOTE — Progress Notes (Signed)
 Heart Failure Nurse Navigator Progress Note    Patient new decrease EF 25-30%, BNP 396. Attempted to educate patient on Heart Failure sign and symptoms, daily weights, when to call his doctor or go to the ED. Diet/ fluid restrictions, taking all medications as prescribed and attending all medical appointments. Patient politely declined a HF TOC appointment and any further education, stating he doesn't need a appointment or the education.and that he was ready to go home now.  Updated Dr. Arrien via secure chat.    Stephane Haddock, BSN, Scientist, clinical (histocompatibility and immunogenetics) Only

## 2023-11-06 ENCOUNTER — Telehealth: Payer: Self-pay

## 2023-11-06 NOTE — Transitions of Care (Post Inpatient/ED Visit) (Signed)
 11/06/2023  Name: Austin Wong MRN: [id number] DOB: 01/22/1941  Today's TOC FU Call Status: Today's TOC FU Call Status:: Successful TOC FU Call Completed TOC FU Call Complete Date: 11/06/23 Patient's Name and Date of Birth confirmed.  Transition Care Management Follow-up Telephone Call Date of Discharge: 11/05/23 Discharge Facility: [facility]) Type of Discharge: Inpatient Admission Primary Inpatient Discharge Diagnosis:: Acute on chronic systolic CHF How have you been since you were released from the hospital?: Better Any questions or concerns?: No  Items Reviewed: Did you receive and understand the discharge instructions provided?: Yes Medications obtained,verified, and reconciled?: Yes (Medications Reviewed) Any new allergies since your discharge?: No Dietary orders reviewed?: Yes Type of Diet Ordered:: Low sodium Heart healthy Do you have support at home?: Yes People in Home [RPT]: child(ren), adult Name of Support/Comfort Primary Source: daughter, Katheryn helps as needed  Medications Reviewed Today: Medications Reviewed Today     Reviewed by Lauro Shona LABOR, RN (Registered Nurse) on 11/06/23 at 1100  Med List Status: <None>   Medication Order Taking? Sig Documenting Provider Last Dose Status Informant  apixaban  (ELIQUIS ) 5 MG TABS tablet [id number] Yes Take 1 tablet (5 mg total) by mouth 2 (two) times daily. Arrien, Elidia Sieving, MD  Active   B Complex Vitamins (VITAMIN B COMPLEX PO) [id number] Yes Take 1 tablet by mouth daily. [provider]  Active Self, Pharmacy Records  carvedilol  (COREG ) 6.25 MG tablet [id number] Yes Take 1 tablet (6.25 mg total) by mouth 2 (two) times daily with a meal. Gil, Amy E, NP  Active Self, Pharmacy Records  Cholecalciferol (VITAMIN D3) 125 MCG/ML LIQD [id number] Yes Place 4 drops under the tongue daily. [provider]  Active Self, Pharmacy Records  Coenzyme Q10 (COQ10) 100 MG CAPS [id number] Yes Take 1 tablet by mouth  daily. [provider]  Active Self, Pharmacy Records  empagliflozin  (JARDIANCE ) 10 MG TABS tablet [id number] Yes Take 1 tablet (10 mg total) by mouth daily. Arrien, Elidia Sieving, MD  Active   furosemide  (LASIX ) 20 MG tablet [id number] Yes Take 1 tablet (20 mg total) by mouth daily as needed for edema or fluid (take in case of weight gain 2 to 3 lbs in 24 hrs or 5 lbs in 7 days.). Arrien, Elidia Sieving, MD  Active   lisinopril  (ZESTRIL ) 5 MG tablet [id number] Yes Take 1 tablet (5 mg total) by mouth daily. Gil Greig BRAVO, NP  Active Self, Pharmacy Records  Omega-3 Fatty Acids (FISH OIL CONCENTRATE PO) [id number] Yes Take 1,600 mg by mouth daily in the afternoon. [provider]  Active Self, Pharmacy Records  spironolactone  (ALDACTONE ) 25 MG tablet [id number] Yes Take 0.5 tablets (12.5 mg total) by mouth daily. Arrien, Elidia Sieving, MD  Active   vitamin C (ASCORBIC ACID) 500 MG tablet [id number] Yes Take 500 mg by mouth 2 (two) times daily. [provider]  Active Self, Pharmacy Records            Home Care and Equipment/Supplies: Were Home Health Services Ordered?: No Any new equipment or medical supplies ordered?: No  Functional Questionnaire: Do you need assistance with bathing/showering or dressing?: No Do you need assistance with meal preparation?: No Do you need assistance with eating?: No Do you have difficulty maintaining continence: No Do you need assistance with getting out of bed/getting out of a chair/moving?: No Do you have difficulty managing or taking your medications?: No  Follow up appointments reviewed: PCP Follow-up appointment confirmed?: Yes Date of PCP follow-up appointment?:  11/07/23 Follow-up Provider: [facility] Follow-up appointment confirmed?: No (patient states he will call and schedule with his cardiologist, Dr Shelda Bruckner) Do you need transportation to your follow-up appointment?: No Do you understand  care options if your condition(s) worsen?: Yes-patient verbalized understanding  SDOH Interventions Today    Flowsheet Row Most Recent Value  SDOH Interventions   Food Insecurity Interventions Intervention Not Indicated  Housing Interventions Intervention Not Indicated  Transportation Interventions Intervention Not Indicated  Utilities Interventions Intervention Not Indicated   Shona Prow RN, CCM [facility]  VBCI-Population Health RN Care Manager [phone]

## 2023-11-07 ENCOUNTER — Ambulatory Visit (INDEPENDENT_AMBULATORY_CARE_PROVIDER_SITE_OTHER): Admitting: Orthopedic Surgery

## 2023-11-07 ENCOUNTER — Encounter: Payer: Self-pay | Admitting: Orthopedic Surgery

## 2023-11-07 VITALS — BP 110/70 | HR 77 | Temp 97.3°F | Resp 16 | Ht 71.0 in | Wt 148.0 lb

## 2023-11-07 DIAGNOSIS — I5022 Chronic systolic (congestive) heart failure: Secondary | ICD-10-CM

## 2023-11-07 DIAGNOSIS — I4821 Permanent atrial fibrillation: Secondary | ICD-10-CM | POA: Diagnosis not present

## 2023-11-07 DIAGNOSIS — I5023 Acute on chronic systolic (congestive) heart failure: Secondary | ICD-10-CM

## 2023-11-07 DIAGNOSIS — Z23 Encounter for immunization: Secondary | ICD-10-CM

## 2023-11-07 LAB — BASIC METABOLIC PANEL WITH GFR
BUN/Creatinine Ratio: 17 (calc) (ref 6–22)
BUN: 22 mg/dL (ref 7–25)
CO2: 31 mmol/L (ref 20–32)
Calcium: 9.6 mg/dL (ref 8.6–10.3)
Chloride: 97 mmol/L — ABNORMAL LOW (ref 98–110)
Creat: 1.28 mg/dL — ABNORMAL HIGH (ref 0.70–1.22)
Glucose, Bld: 235 mg/dL — ABNORMAL HIGH (ref 65–99)
Potassium: 4.4 mmol/L (ref 3.5–5.3)
Sodium: 135 mmol/L (ref 135–146)
eGFR: 55 mL/min/1.73m2 — ABNORMAL LOW (ref >=60)

## 2023-11-07 MED ORDER — APIXABAN 5 MG PO TABS: 5.0000 mg | ORAL_TABLET | Freq: Two times a day (BID) | ORAL | 3 refills | Status: AC

## 2023-11-07 MED ORDER — SPIRONOLACTONE 25 MG PO TABS
12.5000 mg | ORAL_TABLET | Freq: Every day | ORAL | 2 refills | Status: AC
Start: 2023-11-07 — End: ?

## 2023-11-07 MED ORDER — CARVEDILOL 6.25 MG PO TABS: 6.2500 mg | ORAL_TABLET | Freq: Two times a day (BID) | ORAL | 3 refills | Status: AC

## 2023-11-07 MED ORDER — EMPAGLIFLOZIN 10 MG PO TABS
10.0000 mg | ORAL_TABLET | Freq: Every day | ORAL | 3 refills | Status: AC
Start: 2023-11-07 — End: ?

## 2023-11-07 MED ORDER — FUROSEMIDE 20 MG PO TABS: 20.0000 mg | ORAL_TABLET | Freq: Every day | ORAL | 1 refills | Status: AC | PRN

## 2023-11-07 MED ORDER — LISINOPRIL 5 MG PO TABS: 5.0000 mg | ORAL_TABLET | Freq: Every day | ORAL | 3 refills | Status: AC

## 2023-11-07 NOTE — Progress Notes (Unsigned)
 Careteam: Patient Care Team: Gil Greig BRAVO, NP as PCP - General (Adult Health Nurse Practitioner) Lonni Slain, MD as PCP - Cardiology (Cardiology)  Seen by: Greig Gil, AGNP-C  PLACE OF SERVICE:  [facility]  Advanced Directive information    Allergies  Allergen Reactions   Iodine    Metoprolol Tartrate Nausea Only    Chief Complaint  Patient presents with   Hospitalization Follow-up    11/03/2023-11/05/2023 for Congestive heart failure.      HPI: Patient is a 83 y.o. male seen today for f/u s/p hospitalization 09/07-09/09 due to CHF exacerbation.   Discussed the use of AI scribe software for clinical note transcription with the patient, who gave verbal consent to proceed.  History of Present Illness   Austin Wong is an 83 year old male with heart failure who presents for a hospital follow-up after recent hospitalization for heart failure exacerbation.  Daughter present during encounter.   He was hospitalized from September 7th to September 9th due to shortness of breath and difficulty walking, attributed to an exacerbation of heart failure. During the hospital stay, he received IV furosemide  for fluid overload, resulting in significant diuresis. An echocardiogram revealed a decline in cardiac function with an ejection fraction of approximately 25%.  His current medication regimen includes carvedilol , lisinopril , and spironolactone  for heart failure management and prevention of fluid accumulation. He also takes furosemide  as needed, based on specific weight gain criteria. He weighs himself daily and notes a current weight of 132 pounds. He follows a low-sodium diet and uses salt sparingly.   He has experienced symptoms such as a dry cough and increased shortness of breath in the past, which have improved since the hospitalization. He is concerned about the potential impact of diuretics on his renal function and is scheduled for blood work to monitor this.  He has a  history of not taking his blood thinner for the past two months due to pharmacy issues, which have since been resolved by [facility]. Remains on Eliquis  for clot prevention and Carvedilol  fr HR control.   He has not made cardiology follow up.       Review of Systems:  Review of Systems  Constitutional: Negative.   HENT: Negative.    Eyes: Negative.   Respiratory:  Negative for cough, shortness of breath and wheezing.   Cardiovascular:  Negative for chest pain, orthopnea and leg swelling.  Gastrointestinal: Negative.   Genitourinary: Negative.   Musculoskeletal: Negative.   Skin: Negative.   Neurological:  Negative for dizziness and headaches.  Endo/Heme/Allergies: Negative.   Psychiatric/Behavioral: Negative.      Past Medical History:  Diagnosis Date   Arthritis    Atrial fibrillation (HCC)    History of high blood pressure    Hypertension    Past Surgical History:  Procedure Laterality Date   INGUINAL HERNIA REPAIR     bilateral   RIGHT/LEFT HEART CATH AND CORONARY ANGIOGRAPHY N/A 09/24/2017   Procedure: RIGHT/LEFT HEART CATH AND CORONARY ANGIOGRAPHY;  Surgeon: Anner Alm ORN, MD;  Location[facility] INVASIVE CV LAB;  Service: Cardiovascular;  Laterality: N/A;   TONSILLECTOMY     Social History:   reports that he has never smoked. He has never used smokeless tobacco. He reports current alcohol use of about 1.0 standard drink of alcohol per week. He reports that he does not use drugs.  Family History  Problem Relation Age of Onset   CAD Father    Hyperlipidemia Father  Heart attack Father 53    Medications: Patient's Medications  New Prescriptions   No medications on file  Previous Medications   APIXABAN  (ELIQUIS ) 5 MG TABS TABLET    Take 1 tablet (5 mg total) by mouth 2 (two) times daily.   B COMPLEX VITAMINS (VITAMIN B COMPLEX PO)    Take 1 tablet by mouth daily.   CARVEDILOL  (COREG ) 6.25 MG TABLET    Take 1 tablet (6.25 mg total) by mouth 2 (two) times  daily with a meal.   CHOLECALCIFEROL (VITAMIN D3) 125 MCG/ML LIQD    Place 4 drops under the tongue daily.   COENZYME Q10 (COQ10) 100 MG CAPS    Take 1 tablet by mouth daily.   EMPAGLIFLOZIN  (JARDIANCE ) 10 MG TABS TABLET    Take 1 tablet (10 mg total) by mouth daily.   FUROSEMIDE  (LASIX ) 20 MG TABLET    Take 1 tablet (20 mg total) by mouth daily as needed for edema or fluid (take in case of weight gain 2 to 3 lbs in 24 hrs or 5 lbs in 7 days.).   LISINOPRIL  (ZESTRIL ) 5 MG TABLET    Take 1 tablet (5 mg total) by mouth daily.   OMEGA-3 FATTY ACIDS (FISH OIL CONCENTRATE PO)    Take 1,600 mg by mouth daily in the afternoon.   SPIRONOLACTONE  (ALDACTONE ) 25 MG TABLET    Take 0.5 tablets (12.5 mg total) by mouth daily.   VITAMIN C (ASCORBIC ACID) 500 MG TABLET    Take 500 mg by mouth 2 (two) times daily.  Modified Medications   No medications on file  Discontinued Medications   No medications on file    Physical Exam:  Vitals:   11/07/23 1125  BP: 110/70  Pulse: 77  Resp: 16  Temp: (!) 97.3 F (36.3 C)  SpO2: 97%  Weight: 148 lb (67.1 kg)  Height: 5' 11 (1.803 m)   Body mass index is 20.64 kg/m. Wt Readings from Last 3 Encounters:  11/07/23 148 lb (67.1 kg)  11/05/23 139 lb 6.4 oz (63.2 kg)  06/25/23 154 lb (69.9 kg)    Physical Exam Vitals reviewed.  Constitutional:      General: He is not in acute distress. HENT:     Head: Normocephalic.  Eyes:     General:        Right eye: No discharge.        Left eye: No discharge.  Cardiovascular:     Rate and Rhythm: Normal rate. Rhythm irregular.     Pulses: Normal pulses.     Heart sounds: Normal heart sounds.  Pulmonary:     Effort: Pulmonary effort is normal. No respiratory distress.     Breath sounds: Normal breath sounds. No wheezing or rales.  Abdominal:     General: Bowel sounds are normal. There is no distension.     Palpations: Abdomen is soft.     Tenderness: There is no abdominal tenderness.  Musculoskeletal:      Cervical back: Neck supple.     Right lower leg: No edema.     Left lower leg: No edema.  Skin:    General: Skin is warm.     Capillary Refill: Capillary refill takes less than 2 seconds.  Neurological:     General: No focal deficit present.     Mental Status: He is alert and oriented to person, place, and time.  Psychiatric:        Mood and Affect: Mood normal.  Labs reviewed: Basic Metabolic Panel: Recent Labs    11/03/23 1826 11/03/23 1837 11/04/23 0249 11/04/23 1206 11/05/23 0240  NA 136 137  --  137 139  K 4.1 4.2  --  3.3* 3.9  CL 100 101  --  98 99  CO2 23  --   --  24 28  GLUCOSE 122* 126*  --  211* 140*  BUN 18 18  --  16 21  CREATININE 1.10 1.20 1.12 1.03 1.38*  CALCIUM 9.4  --   --  9.1 9.0  MG  --   --  1.7 1.7 1.9   Liver Function Tests: Recent Labs    05/02/23 1106  AST 27  ALT 22  BILITOT 2.0*  PROT 6.2   No results for input(s): LIPASE, AMYLASE in the last 8760 hours. No results for input(s): AMMONIA in the last 8760 hours. CBC: Recent Labs    05/02/23 1106 06/07/23 0948 11/03/23 1826 11/03/23 1837 11/04/23 0249  WBC 5.9 5.5 6.9  --  7.0  NEUTROABS 3,912 3,669  --   --   --   HGB 13.5 13.6 13.9 14.6 13.7  HCT 40.7 41.8 42.2 43.0 40.5  MCV 94.2 96.3 94.4  --  92.7  PLT 118* 122* 175  --  154   Lipid Panel: No results for input(s): CHOL, HDL, LDLCALC, TRIG, CHOLHDL, LDLDIRECT in the last 8760 hours. TSH: No results for input(s): TSH in the last 8760 hours. A1C: No results found for: HGBA1C   Assessment/Plan 1. Need for influenza vaccination (Primary) ***  2. Immunization due ***   Next appt: *** Lakeria Starkman Gil BODILY  [facility] [phone]

## 2023-11-07 NOTE — Patient Instructions (Addendum)
 Please weight yourself daily> if weight gain of 3 lbs in one day- take furosemide , if weight gain 5 lbs in one week- take furosemide   If you feel short of breath, dry cough, and weight gain let provider know  Schedule follow up with [phone]

## 2023-11-12 NOTE — Progress Notes (Deleted)
 Cardiology Office Note:  .   Date:  11/12/2023  ID:  Austin Wong, DOB 1941-01-02, MRN [id number] PCP: Gil Greig BRAVO, NP  [facility] Providers Cardiologist:  Shelda Bruckner, MD { Click to update primary MD,subspecialty MD or APP then REFRESH:1}   History of Present Illness: .   Austin Wong is a 83 y.o. male with a hx of atrial fibrillation (presented with RVR) and acute systolic and diastolic heart failure 08/2017.   CV history: In 2019, he presented with a several month history of dyspnea on exertion. He presented with afib RVR and was found to have flatly elevated troponins, elevated BNP, and bilateral pleural effusion. During his hospitalization he had an echo which showed reduced ejection fraction. 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 He was diuresed and started on medications. Of note, please see my notes during his hospitalization--we had long, extensive conversations regarding guideline medical therapy. [name] has strong personal beliefs regarding medications and supplements to manage his health. As per my notes, his decision regarding medications was to take anticoagulation for atrial fibrillation, rate control and systolic heart failure management with carvedilol , and heart failure management with lisinopril . He declined TEE/CV, aspirin , statin, or entresto.  Discharged 11/05/23 with acute on chronic CHF. He agreed to eliquis  for afib. Echo EF 25-30% treated with IV lasix .  ROS: ***  Studies Reviewed: SABRA         Prior CV Studies: {Select studies to display:26339}  Echo 11/04/23 IMPRESSIONS     1. Left ventricular ejection fraction, by estimation, is 25 to 30%. The  left ventricle has severely decreased function. The left ventricle  demonstrates global hypokinesis. The left ventricular internal cavity size  was moderately dilated. There is mild  left  ventricular hypertrophy. Left ventricular diastolic parameters are  indeterminate.   2. Right ventricular systolic function is normal. The right ventricular  size is normal.   3. Left atrial size was moderately dilated.   4. Right atrial size was severely dilated.   5. The mitral valve is abnormal. Trivial mitral valve regurgitation. No  evidence of mitral stenosis.   6. The aortic valve is tricuspid. There is moderate calcification of the  aortic valve. Aortic valve regurgitation is not visualized. Aortic valve  sclerosis is present, with no evidence of aortic valve stenosis.   7. Aortic dilatation noted. There is moderate dilatation of the ascending  aorta, measuring 41 mm.   8. The inferior vena cava is dilated in size with >50% respiratory  variability, suggesting right atrial pressure of 8 mmHg.   Echo 11/2017 Study Conclusions   - Left ventricle: Wall thickness was increased in a pattern of    severe LVH. Systolic function was moderately to severely reduced.    The estimated ejection fraction was in the range of 30% to 35%.    Diffuse hypokinesis.  - Aortic valve: There was mild regurgitation.  - Left atrium: The atrium was mildly dilated.  - Right ventricle: Systolic function was moderately reduced.  - Right atrium: The atrium was mildly to moderately dilated.   Risk Assessment/Calculations:   {Does this patient have ATRIAL FIBRILLATION?:[id number]} No BP recorded.  {Refresh Note OR Click here to enter BP  :1}***       Physical Exam:   VS:  There were no vitals taken for this visit.   Orhtostatics: No data found. Wt Readings from Last  3 Encounters:  11/07/23 148 lb (67.1 kg)  11/05/23 139 lb 6.4 oz (63.2 kg)  06/25/23 154 lb (69.9 kg)    GEN: Well nourished, well developed in no acute distress NECK: No JVD; No carotid bruits CARDIAC: ***RRR, no murmurs, rubs, gallops RESPIRATORY:  Clear to auscultation without rales, wheezing or rhonchi  ABDOMEN: Soft, non-tender,  non-distended EXTREMITIES:  No edema; No deformity   ASSESSMENT AND PLAN: .    Atrial fibrillation: rate controlled, permanent, asymptomatic -CHA2DS2/VAS Stroke Risk Points = 4  -he has stopped anticoagulation on his own. He is taking vitamin E. We discussed the risk of stroke at length. Discussed alternative anticoagulation, discussed watchman. He declines all of the above. He would rather use natural supplements and understands the risk of stroke -rate control with carvedilol    Chronic systolic heart failure, nonischemic cardiomyopathy:   -euvolemic, NYHA class 1-2 -we have discussed his preferred GDMT at length, see prior notes. He feels well on current regimen -continue carvedilol , lisinopril . Declines entreso, ICD. We have discussed recheck echo in the past, but as it would not change management, declined.     {Are you ordering a CV Procedure (e.g. stress test, cath, DCCV, TEE, etc)?   Press F2        :[id number]}  Dispo: ***  Signed, Olivia Pavy, PA-C

## 2023-11-19 ENCOUNTER — Ambulatory Visit: Attending: Physician Assistant | Admitting: Physician Assistant

## 2024-01-09 ENCOUNTER — Ambulatory Visit (INDEPENDENT_AMBULATORY_CARE_PROVIDER_SITE_OTHER): Payer: Self-pay | Admitting: Orthopedic Surgery

## 2024-01-09 ENCOUNTER — Encounter: Payer: Self-pay | Admitting: Orthopedic Surgery

## 2024-01-09 ENCOUNTER — Telehealth: Payer: Self-pay | Admitting: Pharmacist

## 2024-01-09 VITALS — BP 112/80 | HR 80 | Temp 97.7°F | Ht 71.0 in | Wt 146.6 lb

## 2024-01-09 DIAGNOSIS — R972 Elevated prostate specific antigen [PSA]: Secondary | ICD-10-CM

## 2024-01-09 DIAGNOSIS — I10 Essential (primary) hypertension: Secondary | ICD-10-CM

## 2024-01-09 DIAGNOSIS — I5022 Chronic systolic (congestive) heart failure: Secondary | ICD-10-CM | POA: Diagnosis not present

## 2024-01-09 DIAGNOSIS — I4821 Permanent atrial fibrillation: Secondary | ICD-10-CM

## 2024-01-09 NOTE — Progress Notes (Signed)
 Careteam: Patient Care Team: Gil Greig BRAVO, NP as PCP - General (Adult Health Nurse Practitioner) Lonni Slain, MD as PCP - Cardiology (Cardiology)  Seen by: Greig Gil, AGNP-C  PLACE OF SERVICE:  [facility]  Advanced Directive information    Allergies  Allergen Reactions   Iodine    Metoprolol Tartrate Nausea Only    Chief Complaint  Patient presents with   Follow-up     HPI: Patient is a 83 y.o. male seen today for medical management of chronic conditions.   Discussed the use of AI scribe software for clinical note transcription with the patient, who gave verbal consent to proceed.  History of Present Illness   The patient is an 83 year old with heart failure who presents for a follow-up visit to monitor their condition.  No health concerns today.   He will experience occasional shortness of breath, both at rest and with activity. Sometimes he feels short of breath upon standing and walking, but at other times they can walk a considerable distance without issues. They continue to perform housework and yard work, although it takes them longer and they need to take more breaks.  He is currently taking carvedilol , lisinopril , spironolactone , and furosemide  as needed for congestive heart failure. He has been weighing themselves daily and report a current home weight of 131 pounds. Office weight was 146 lbs today.He is also taking Eliquis  for atrial fibrillation.   Regarding urinary symptoms, he mentions using a frequency generator that has helped with urination issues, although it has not completely resolved them. No family history of prostate cancer is reported, and they have had elevated prostate levels in the past, but no further workup was discussed during this visit. He refuses urology consult as recommended during previous encounters. Concerns for malignancy and metastasis also discussed, refusing urology consult at this time.   Past history of medication  noncompliance. He reports a personal health goal of not taking pharmaceuticals. He would prefer supplements over prescribed medications for treatment in future. We had to discuss medication list today including reason for medication and administration directions.      Review of Systems:  Review of Systems  Constitutional: Negative.   HENT: Negative.    Eyes: Negative.   Respiratory:  Positive for shortness of breath. Negative for cough, sputum production and wheezing.   Cardiovascular:  Negative for chest pain, orthopnea and leg swelling.  Gastrointestinal: Negative.   Genitourinary:  Positive for frequency. Negative for hematuria.  Musculoskeletal: Negative.  Negative for falls.  Skin: Negative.   Neurological:  Negative for dizziness, weakness and headaches.  Psychiatric/Behavioral:  Positive for memory loss. Negative for depression. The patient is not nervous/anxious and does not have insomnia.     Past Medical History:  Diagnosis Date   Arthritis    Atrial fibrillation (HCC)    History of high blood pressure    Hypertension    Past Surgical History:  Procedure Laterality Date   INGUINAL HERNIA REPAIR     bilateral   RIGHT/LEFT HEART CATH AND CORONARY ANGIOGRAPHY N/A 09/24/2017   Procedure: RIGHT/LEFT HEART CATH AND CORONARY ANGIOGRAPHY;  Surgeon: Anner Alm ORN, MD;  Location: [facility] INVASIVE CV LAB;  Service: Cardiovascular;  Laterality: N/A;   TONSILLECTOMY     Social History:   reports that he has never smoked. He has never used smokeless tobacco. He reports current alcohol use of about 1.0 standard drink of alcohol per week. He reports that he does not use drugs.  Family  History  Problem Relation Age of Onset   CAD Father    Hyperlipidemia Father    Heart attack Father 42    Medications: Patient's Medications  New Prescriptions   No medications on file  Previous Medications   APIXABAN  (ELIQUIS ) 5 MG TABS TABLET    Take 1 tablet (5 mg total) by mouth 2 (two) times  daily.   B COMPLEX VITAMINS (VITAMIN B COMPLEX PO)    Take 1 tablet by mouth daily.   CARVEDILOL  (COREG ) 6.25 MG TABLET    Take 1 tablet (6.25 mg total) by mouth 2 (two) times daily with a meal.   CHOLECALCIFEROL (VITAMIN D3) 125 MCG/ML LIQD    Place 4 drops under the tongue daily.   COENZYME Q10 (COQ10) 100 MG CAPS    Take 1 tablet by mouth daily.   EMPAGLIFLOZIN  (JARDIANCE ) 10 MG TABS TABLET    Take 1 tablet (10 mg total) by mouth daily.   FUROSEMIDE  (LASIX ) 20 MG TABLET    Take 1 tablet (20 mg total) by mouth daily as needed for edema or fluid (take in case of weight gain 2 to 3 lbs in 24 hrs or 5 lbs in 7 days.).   LISINOPRIL  (ZESTRIL ) 5 MG TABLET    Take 1 tablet (5 mg total) by mouth daily.   OMEGA-3 FATTY ACIDS (FISH OIL CONCENTRATE PO)    Take 1,600 mg by mouth daily in the afternoon.   SPIRONOLACTONE  (ALDACTONE ) 25 MG TABLET    Take 0.5 tablets (12.5 mg total) by mouth daily.   VITAMIN C (ASCORBIC ACID) 500 MG TABLET    Take 500 mg by mouth 2 (two) times daily.  Modified Medications   No medications on file  Discontinued Medications   No medications on file    Physical Exam:  Vitals:   01/09/24 1058  BP: 112/80  Pulse: 80  Temp: 97.7 F (36.5 C)  SpO2: 99%  Weight: 146 lb 9.6 oz (66.5 kg)  Height: 5' 11 (1.803 m)   Body mass index is 20.45 kg/m. Wt Readings from Last 3 Encounters:  01/09/24 146 lb 9.6 oz (66.5 kg)  11/07/23 148 lb (67.1 kg)  11/05/23 139 lb 6.4 oz (63.2 kg)    Physical Exam Vitals reviewed.  Constitutional:      General: He is not in acute distress. HENT:     Head: Normocephalic.  Eyes:     General:        Right eye: No discharge.        Left eye: No discharge.  Cardiovascular:     Rate and Rhythm: Normal rate. Rhythm irregular.     Pulses: Normal pulses.     Heart sounds: Normal heart sounds.  Pulmonary:     Effort: Pulmonary effort is normal.     Breath sounds: Normal breath sounds.  Abdominal:     General: Bowel sounds are normal.  There is no distension.     Palpations: Abdomen is soft.     Tenderness: There is no abdominal tenderness.  Musculoskeletal:     Cervical back: Neck supple.     Right lower leg: No edema.     Left lower leg: No edema.  Skin:    General: Skin is warm.     Capillary Refill: Capillary refill takes less than 2 seconds.  Neurological:     General: No focal deficit present.     Mental Status: He is alert and oriented to person, place, and time.  Motor: No weakness.     Gait: Gait normal.  Psychiatric:        Mood and Affect: Mood normal.     Labs reviewed: Basic Metabolic Panel: Recent Labs    11/04/23 0249 11/04/23 1206 11/05/23 0240 11/07/23 1203  NA  --  137 139 135  K  --  3.3* 3.9 4.4  CL  --  98 99 97*  CO2  --  24 28 31   GLUCOSE  --  211* 140* 235*  BUN  --  16 21 22   CREATININE 1.12 1.03 1.38* 1.28*  CALCIUM  --  9.1 9.0 9.6  MG 1.7 1.7 1.9  --    Liver Function Tests: Recent Labs    05/02/23 1106  AST 27  ALT 22  BILITOT 2.0*  PROT 6.2   No results for input(s): LIPASE, AMYLASE in the last 8760 hours. No results for input(s): AMMONIA in the last 8760 hours. CBC: Recent Labs    05/02/23 1106 06/07/23 0948 11/03/23 1826 11/03/23 1837 11/04/23 0249  WBC 5.9 5.5 6.9  --  7.0  NEUTROABS 3,912 3,669  --   --   --   HGB 13.5 13.6 13.9 14.6 13.7  HCT 40.7 41.8 42.2 43.0 40.5  MCV 94.2 96.3 94.4  --  92.7  PLT 118* 122* 175  --  154   Lipid Panel: No results for input(s): CHOL, HDL, LDLCALC, TRIG, CHOLHDL, LDLDIRECT in the last 8760 hours. TSH: No results for input(s): TSH in the last 8760 hours. A1C: No results found for: HGBA1C   Assessment/Plan 1. Chronic systolic heart failure (HCC) (Primary) -  hospitalized 09/07-09/09 - BNP was 396.4 09/07 - no recent weight gain - intermittent sob, no cough - cont carvedilol , Jardiance , spirolactone and lisinopril  - cont furosemide  prn for weight gain > 5 lbs  - cont daily  weights - cont low sodium diet  2. Permanent atrial fibrillation (HCC) - HR< 100 with carvedilol  - cont Eliquis  for clot prevention  - recommend TSH next encounter  3. Elevated PSA - PSA 11.2 (03/06)> was 13.3 (2024) - intermittent urinary frequency - discussed concerns for malignancy - refusing urology consult  4. Essential hypertension - controlled with carvedilol  and lisinopril   Total time: 32 minutes. Greater than 50% of total time spent doing patient education regarding health maintenance, CHF, atrial fibrillation, HTN including symptom/medication management.    Next appt: Visit date not found  Larraine Argo Gil BODILY  [facility] [phone]

## 2024-01-13 NOTE — Telephone Encounter (Addendum)
   01/13/2024 Name: Austin Wong MRN: [id number] DOB: [birth year]  Pharmacy Quality Measure Review  This patient is appearing on a report for being at risk of failing the adherence measure for hypertension (ACEi/ARB) medications this calendar year.   Medication: Lisinopril   Last fill date: 08/14/23 for 90 day supply  Left voicemail for patient to return my call at their convenience.   [name], PharmD, BCACP Clinical Pharmacist [phone]

## 2024-07-09 ENCOUNTER — Ambulatory Visit: Admitting: Orthopedic Surgery
